# Patient Record
Sex: Male | Born: 1945 | Race: White | Hispanic: No | Marital: Married | Smoking: Never smoker
Health system: Southern US, Community
[De-identification: ages and names within clinical notes are randomized; demographics above are authoritative.]

## PROBLEM LIST (undated history)

## (undated) DIAGNOSIS — I10 Essential (primary) hypertension: Secondary | ICD-10-CM

## (undated) DIAGNOSIS — Z85528 Personal history of other malignant neoplasm of kidney: Secondary | ICD-10-CM

## (undated) DIAGNOSIS — E7800 Pure hypercholesterolemia, unspecified: Secondary | ICD-10-CM

## (undated) DIAGNOSIS — M1990 Unspecified osteoarthritis, unspecified site: Secondary | ICD-10-CM

## (undated) DIAGNOSIS — G4733 Obstructive sleep apnea (adult) (pediatric): Secondary | ICD-10-CM

## (undated) DIAGNOSIS — E119 Type 2 diabetes mellitus without complications: Secondary | ICD-10-CM

## (undated) DIAGNOSIS — K219 Gastro-esophageal reflux disease without esophagitis: Secondary | ICD-10-CM

## (undated) HISTORY — DX: Personal history of other malignant neoplasm of kidney: Z85.528

## (undated) HISTORY — DX: Gastro-esophageal reflux disease without esophagitis: K21.9

## (undated) HISTORY — PX: NEPHRECTOMY: SHX65

## (undated) HISTORY — DX: Pure hypercholesterolemia, unspecified: E78.00

## (undated) HISTORY — PX: TRANSURETHRAL RESECTION OF PROSTATE: SHX73

## (undated) HISTORY — PX: LUMBAR DISC SURGERY: SHX700

## (undated) HISTORY — DX: Obstructive sleep apnea (adult) (pediatric): G47.33

## (undated) HISTORY — DX: Essential (primary) hypertension: I10

## (undated) HISTORY — DX: Unspecified osteoarthritis, unspecified site: M19.90

## (undated) HISTORY — PX: CHOLECYSTECTOMY: SHX55

## (undated) HISTORY — DX: Type 2 diabetes mellitus without complications: E11.9

---

## 2013-11-06 ENCOUNTER — Institutional Professional Consult (permissible substitution): Payer: Self-pay | Admitting: Critical Care Medicine

## 2015-02-20 ENCOUNTER — Other Ambulatory Visit (HOSPITAL_COMMUNITY): Payer: Self-pay | Admitting: Ophthalmology

## 2015-02-20 DIAGNOSIS — H532 Diplopia: Secondary | ICD-10-CM

## 2015-03-06 ENCOUNTER — Ambulatory Visit (HOSPITAL_COMMUNITY): Admission: RE | Admit: 2015-03-06 | Payer: Medicare Other | Source: Ambulatory Visit

## 2015-03-06 ENCOUNTER — Ambulatory Visit (HOSPITAL_COMMUNITY): Payer: Medicare Other

## 2015-06-08 DIAGNOSIS — M5136 Other intervertebral disc degeneration, lumbar region: Secondary | ICD-10-CM | POA: Insufficient documentation

## 2015-06-08 DIAGNOSIS — M4726 Other spondylosis with radiculopathy, lumbar region: Secondary | ICD-10-CM | POA: Insufficient documentation

## 2015-06-08 DIAGNOSIS — M4316 Spondylolisthesis, lumbar region: Secondary | ICD-10-CM | POA: Insufficient documentation

## 2015-06-08 DIAGNOSIS — M51369 Other intervertebral disc degeneration, lumbar region without mention of lumbar back pain or lower extremity pain: Secondary | ICD-10-CM | POA: Insufficient documentation

## 2015-07-14 DIAGNOSIS — M25562 Pain in left knee: Secondary | ICD-10-CM | POA: Insufficient documentation

## 2016-04-05 DIAGNOSIS — M5416 Radiculopathy, lumbar region: Secondary | ICD-10-CM | POA: Insufficient documentation

## 2016-04-05 DIAGNOSIS — N3945 Continuous leakage: Secondary | ICD-10-CM | POA: Insufficient documentation

## 2016-06-08 DIAGNOSIS — N319 Neuromuscular dysfunction of bladder, unspecified: Secondary | ICD-10-CM | POA: Insufficient documentation

## 2016-06-08 DIAGNOSIS — N509 Disorder of male genital organs, unspecified: Secondary | ICD-10-CM | POA: Insufficient documentation

## 2016-06-08 DIAGNOSIS — N3941 Urge incontinence: Secondary | ICD-10-CM | POA: Insufficient documentation

## 2016-06-09 DIAGNOSIS — N179 Acute kidney failure, unspecified: Secondary | ICD-10-CM | POA: Insufficient documentation

## 2016-06-09 DIAGNOSIS — R310 Gross hematuria: Secondary | ICD-10-CM | POA: Insufficient documentation

## 2016-06-09 DIAGNOSIS — R319 Hematuria, unspecified: Secondary | ICD-10-CM | POA: Insufficient documentation

## 2016-06-12 DIAGNOSIS — R000 Tachycardia, unspecified: Secondary | ICD-10-CM | POA: Insufficient documentation

## 2017-05-02 DIAGNOSIS — C61 Malignant neoplasm of prostate: Secondary | ICD-10-CM | POA: Insufficient documentation

## 2017-06-26 DIAGNOSIS — I10 Essential (primary) hypertension: Secondary | ICD-10-CM | POA: Insufficient documentation

## 2017-06-26 DIAGNOSIS — G4730 Sleep apnea, unspecified: Secondary | ICD-10-CM | POA: Insufficient documentation

## 2017-06-26 DIAGNOSIS — E785 Hyperlipidemia, unspecified: Secondary | ICD-10-CM | POA: Insufficient documentation

## 2018-06-28 ENCOUNTER — Other Ambulatory Visit: Payer: Self-pay | Admitting: Urology

## 2018-06-28 DIAGNOSIS — C61 Malignant neoplasm of prostate: Secondary | ICD-10-CM

## 2018-07-07 ENCOUNTER — Other Ambulatory Visit: Payer: Medicare Other

## 2018-07-12 ENCOUNTER — Ambulatory Visit
Admission: RE | Admit: 2018-07-12 | Discharge: 2018-07-12 | Disposition: A | Discharge status: Home / Self Care | Payer: Medicare Other | Source: Ambulatory Visit | Attending: Urology | Admitting: Urology

## 2018-07-12 DIAGNOSIS — C61 Malignant neoplasm of prostate: Secondary | ICD-10-CM

## 2018-07-12 MED ORDER — GADOBENATE DIMEGLUMINE 529 MG/ML IV SOLN
10.0000 mL | Freq: Once | INTRAVENOUS | Status: AC | PRN
  Administered 2018-07-12: 10 mL via INTRAVENOUS

## 2018-07-27 DIAGNOSIS — E119 Type 2 diabetes mellitus without complications: Secondary | ICD-10-CM

## 2018-07-27 DIAGNOSIS — G4733 Obstructive sleep apnea (adult) (pediatric): Secondary | ICD-10-CM

## 2018-07-27 DIAGNOSIS — K219 Gastro-esophageal reflux disease without esophagitis: Secondary | ICD-10-CM

## 2018-07-27 DIAGNOSIS — I10 Essential (primary) hypertension: Secondary | ICD-10-CM

## 2018-07-27 DIAGNOSIS — N400 Enlarged prostate without lower urinary tract symptoms: Secondary | ICD-10-CM

## 2018-07-27 DIAGNOSIS — C61 Malignant neoplasm of prostate: Secondary | ICD-10-CM | POA: Diagnosis not present

## 2018-07-27 DIAGNOSIS — N183 Chronic kidney disease, stage 3 (moderate): Secondary | ICD-10-CM

## 2018-07-27 DIAGNOSIS — R079 Chest pain, unspecified: Secondary | ICD-10-CM

## 2018-07-27 DIAGNOSIS — E785 Hyperlipidemia, unspecified: Secondary | ICD-10-CM

## 2018-07-28 DIAGNOSIS — C61 Malignant neoplasm of prostate: Secondary | ICD-10-CM | POA: Diagnosis not present

## 2018-07-28 DIAGNOSIS — E119 Type 2 diabetes mellitus without complications: Secondary | ICD-10-CM | POA: Diagnosis not present

## 2018-07-28 DIAGNOSIS — N183 Chronic kidney disease, stage 3 (moderate): Secondary | ICD-10-CM | POA: Diagnosis not present

## 2018-07-28 DIAGNOSIS — R079 Chest pain, unspecified: Secondary | ICD-10-CM | POA: Diagnosis not present

## 2018-07-29 DIAGNOSIS — R079 Chest pain, unspecified: Secondary | ICD-10-CM | POA: Diagnosis not present

## 2019-03-01 ENCOUNTER — Ambulatory Visit: Payer: Medicare Other | Admitting: Sports Medicine

## 2019-03-01 ENCOUNTER — Encounter: Payer: Self-pay | Admitting: Sports Medicine

## 2019-03-01 ENCOUNTER — Other Ambulatory Visit: Payer: Self-pay

## 2019-03-01 VITALS — BP 123/75 | HR 81 | Temp 96.5°F

## 2019-03-01 DIAGNOSIS — M79675 Pain in left toe(s): Secondary | ICD-10-CM

## 2019-03-01 DIAGNOSIS — E1140 Type 2 diabetes mellitus with diabetic neuropathy, unspecified: Secondary | ICD-10-CM | POA: Diagnosis not present

## 2019-03-01 DIAGNOSIS — B351 Tinea unguium: Secondary | ICD-10-CM

## 2019-03-01 DIAGNOSIS — M79674 Pain in right toe(s): Secondary | ICD-10-CM

## 2019-03-01 NOTE — Patient Instructions (Signed)
Diabetes Mellitus and Foot Care  Foot care is an important part of your health, especially when you have diabetes. Diabetes may cause you to have problems because of poor blood flow (circulation) to your feet and legs, which can cause your skin to:   Become thinner and drier.   Break more easily.   Heal more slowly.   Peel and crack.  You may also have nerve damage (neuropathy) in your legs and feet, causing decreased feeling in them. This means that you may not notice minor injuries to your feet that could lead to more serious problems. Noticing and addressing any potential problems early is the best way to prevent future foot problems.  How to care for your feet  Foot hygiene   Wash your feet daily with warm water and mild soap. Do not use hot water. Then, pat your feet and the areas between your toes until they are completely dry. Do not soak your feet as this can dry your skin.   Trim your toenails straight across. Do not dig under them or around the cuticle. File the edges of your nails with an emery board or nail file.   Apply a moisturizing lotion or petroleum jelly to the skin on your feet and to dry, brittle toenails. Use lotion that does not contain alcohol and is unscented. Do not apply lotion between your toes.  Shoes and socks   Wear clean socks or stockings every day. Make sure they are not too tight. Do not wear knee-high stockings since they may decrease blood flow to your legs.   Wear shoes that fit properly and have enough cushioning. Always look in your shoes before you put them on to be sure there are no objects inside.   To break in new shoes, wear them for just a few hours a day. This prevents injuries on your feet.  Wounds, scrapes, corns, and calluses   Check your feet daily for blisters, cuts, bruises, sores, and redness. If you cannot see the bottom of your feet, use a mirror or ask someone for help.   Do not cut corns or calluses or try to remove them with medicine.   If you  find a minor scrape, cut, or break in the skin on your feet, keep it and the skin around it clean and dry. You may clean these areas with mild soap and water. Do not clean the area with peroxide, alcohol, or iodine.   If you have a wound, scrape, corn, or callus on your foot, look at it several times a day to make sure it is healing and not infected. Check for:  ? Redness, swelling, or pain.  ? Fluid or blood.  ? Warmth.  ? Pus or a bad smell.  General instructions   Do not cross your legs. This may decrease blood flow to your feet.   Do not use heating pads or hot water bottles on your feet. They may burn your skin. If you have lost feeling in your feet or legs, you may not know this is happening until it is too late.   Protect your feet from hot and cold by wearing shoes, such as at the beach or on hot pavement.   Schedule a complete foot exam at least once a year (annually) or more often if you have foot problems. If you have foot problems, report any cuts, sores, or bruises to your health care provider immediately.  Contact a health care provider if:     You have a medical condition that increases your risk of infection and you have any cuts, sores, or bruises on your feet.   You have an injury that is not healing.   You have redness on your legs or feet.   You feel burning or tingling in your legs or feet.   You have pain or cramps in your legs and feet.   Your legs or feet are numb.   Your feet always feel cold.   You have pain around a toenail.  Get help right away if:   You have a wound, scrape, corn, or callus on your foot and:  ? You have pain, swelling, or redness that gets worse.  ? You have fluid or blood coming from the wound, scrape, corn, or callus.  ? Your wound, scrape, corn, or callus feels warm to the touch.  ? You have pus or a bad smell coming from the wound, scrape, corn, or callus.  ? You have a fever.  ? You have a red line going up your leg.  Summary   Check your feet every day  for cuts, sores, red spots, swelling, and blisters.   Moisturize feet and legs daily.   Wear shoes that fit properly and have enough cushioning.   If you have foot problems, report any cuts, sores, or bruises to your health care provider immediately.   Schedule a complete foot exam at least once a year (annually) or more often if you have foot problems.  This information is not intended to replace advice given to you by your health care provider. Make sure you discuss any questions you have with your health care provider.  Document Released: 09/09/2000 Document Revised: 10/25/2017 Document Reviewed: 10/14/2016  Elsevier Interactive Patient Education  2019 [employer]

## 2019-03-01 NOTE — Progress Notes (Signed)
Subjective: Alexander King is a 73 y.o. male patient with history of diabetes who presents to office today complaining of long,mildly painful nails while ambulating in shoes; unable to trim. Patient states that the glucose reading this morning was 146 mg/dl. Patient denies any new changes in medication or new problems.Admits to tingling bilateral on Gabapentin.   Review of Systems  All other systems reviewed and are negative.    There are no active problems to display for this patient.  No current outpatient medications on file prior to visit.   No current facility-administered medications on file prior to visit.    Allergies  Allergen Reactions  . Sulfamethoxazole-Trimethoprim Hives, Itching and Rash    HIVES AND ITCHING HIVES AND ITCHING     No results found for this or any previous visit (from the past 2160 hour(s)).  Objective: General: Patient is awake, alert, and oriented x 3 and in no acute distress.  Integument: Skin is warm, dry and supple bilateral. Nails are tender, long, thickened and  dystrophic with subungual debris, consistent with onychomycosis, 1-5 bilateral. No signs of infection. No open lesions or preulcerative lesions present bilateral. Remaining integument unremarkable.  Vasculature:  Dorsalis Pedis pulse 1/4 bilateral. Posterior Tibial pulse  1/4 bilateral.  Capillary fill time <3 sec 1-5 bilateral. Positive hair growth to the level of the digits. Temperature gradient within normal limits. No varicosities present bilateral. No edema present bilateral.   Neurology: The patient has intact sensation measured with a 5.07/10g Semmes Weinstein Monofilament at all pedal sites bilateral . Vibratory sensation diminished bilateral with tuning fork. No Babinski sign present bilateral.   Musculoskeletal: No symptomatic pedal deformities noted bilateral. Muscular strength 5/5 in all lower extremity muscular groups bilateral without pain on range of motion . No tenderness  with calf compression bilateral.  Assessment and Plan: Problem List Items Addressed This Visit    None    Visit Diagnoses    Pain due to onychomycosis of toenails of both feet    -  Primary   Type 2 diabetes mellitus with diabetic neuropathy, without long-term current use of insulin ([place])         -Examined patient. -Discussed and educated patient on diabetic foot care, especially with  regards to the vascular, neurological and musculoskeletal systems.  -Stressed the importance of good glycemic control and the detriment of not  controlling glucose levels in relation to the foot. -Mechanically debrided all nails 1-5 bilateral using sterile nail nipper and filed with dremel without incident  -Answered all patient questions -Patient to return  in 3 months for at risk foot care -Patient advised to call the office if any problems or questions arise in the meantime.  Landis Martins, DPM

## 2019-03-22 ENCOUNTER — Ambulatory Visit (INDEPENDENT_AMBULATORY_CARE_PROVIDER_SITE_OTHER): Payer: Medicare Other | Admitting: Sports Medicine

## 2019-03-22 ENCOUNTER — Encounter: Payer: Self-pay | Admitting: Sports Medicine

## 2019-03-22 ENCOUNTER — Other Ambulatory Visit: Payer: Self-pay

## 2019-03-22 DIAGNOSIS — B351 Tinea unguium: Secondary | ICD-10-CM

## 2019-03-22 DIAGNOSIS — E1140 Type 2 diabetes mellitus with diabetic neuropathy, unspecified: Secondary | ICD-10-CM

## 2019-03-22 DIAGNOSIS — M79674 Pain in right toe(s): Secondary | ICD-10-CM

## 2019-03-22 DIAGNOSIS — M79675 Pain in left toe(s): Secondary | ICD-10-CM

## 2019-03-22 NOTE — Progress Notes (Signed)
Subjective: Alexander King is a 73 y.o. male patient with history of diabetes who presents to office today complaining of nails that seem to be growing out irregular reports that he is noticing that he has some sharp corners on both big toes that are decreased in as well as on the right great toe in the middle there is a roughness that he keeps getting caught on his sock.  Patient reports that he started to reach down and found to trim himself however because he is diabetic return to office. Patient states that the glucose reading this morning was 150 this morning.  There are no active problems to display for this patient.  Current Outpatient Medications on File Prior to Visit  Medication Sig Dispense Refill  . amLODipine (NORVASC) 5 MG tablet     . betaxolol (KERLONE) 10 MG tablet     . Cholecalciferol (VITAMIN D-1000 MAX ST) 25 MCG (1000 UT) tablet Take by mouth.    . clobetasol cream (TEMOVATE) 0.05 % Apply topically.    . diclofenac sodium (VOLTAREN) 1 % GEL     . famotidine (PEPCID) 20 MG tablet     . gabapentin (NEURONTIN) 600 MG tablet     . HUMALOG KWIKPEN 100 UNIT/ML KwikPen     . lovastatin (MEVACOR) 40 MG tablet     . mupirocin ointment (BACTROBAN) 2 %     . nystatin cream (MYCOSTATIN)     . NYSTATIN powder     . omeprazole (PRILOSEC) 40 MG capsule     . TOUJEO SOLOSTAR 300 UNIT/ML SOPN     . VICTOZA 18 MG/3ML SOPN      No current facility-administered medications on file prior to visit.    Allergies  Allergen Reactions  . Sulfamethoxazole-Trimethoprim Hives, Itching and Rash    HIVES AND ITCHING HIVES AND ITCHING     No results found for this or any previous visit (from the past 2160 hour(s)).  Objective: General: Patient is awake, alert, and oriented x 3 and in no acute distress.  Integument: Skin is warm, dry and supple bilateral. Nails are thickened and  dystrophic with subungual debris, consistent with onychomycosis, 1-5 bilateral with lots of dry skin and nail  and bilateral hallux borders with no signs of infection. No open lesions or preulcerative lesions present bilateral. Remaining integument unremarkable.  Vasculature:  Dorsalis Pedis pulse 1/4 bilateral. Posterior Tibial pulse  1/4 bilateral.  Capillary fill time <3 sec 1-5 bilateral. Positive hair growth to the level of the digits. Temperature gradient within normal limits. No varicosities present bilateral. No edema present bilateral.   Neurology: The patient has intact sensation measured with a 5.07/10g Semmes Weinstein Monofilament at all pedal sites bilateral. Vibratory sensation diminished bilateral with tuning fork. No Babinski sign present bilateral.   Musculoskeletal: No symptomatic pedal deformities noted bilateral. Muscular strength 5/5 in all lower extremity muscular groups bilateral without pain on range of motion . No tenderness with calf compression bilateral.  Assessment and Plan: Problem List Items Addressed This Visit    None    Visit Diagnoses    Pain due to onychomycosis of toenails of both feet    -  Primary   Type 2 diabetes mellitus with diabetic neuropathy, without long-term current use of insulin ([place])         -Examined patient. -Mechanically debrided all nails 1-5 bilateral using sterile nail nipper and filed with dremel without incident and no charge since this was done just a few weeks ago -  Advised patient if his big toenails tend to keep growing out into the skin and his nail trims do not last may benefit in the future from a nail avulsion procedure -Answered all patient questions -Patient to return  in 3 months for at risk foot care -Patient advised to call the office if any problems or questions arise in the meantime.  Landis Martins, DPM

## 2019-04-25 ENCOUNTER — Ambulatory Visit: Payer: Medicare Other | Admitting: Physician Assistant

## 2019-05-06 ENCOUNTER — Ambulatory Visit (INDEPENDENT_AMBULATORY_CARE_PROVIDER_SITE_OTHER): Payer: Medicare Other | Admitting: Orthopaedic Surgery

## 2019-05-06 ENCOUNTER — Ambulatory Visit: Payer: Self-pay

## 2019-05-06 ENCOUNTER — Other Ambulatory Visit: Payer: Self-pay

## 2019-05-06 VITALS — Ht 67.0 in | Wt 266.0 lb

## 2019-05-06 DIAGNOSIS — M1712 Unilateral primary osteoarthritis, left knee: Secondary | ICD-10-CM | POA: Diagnosis not present

## 2019-05-06 DIAGNOSIS — M5442 Lumbago with sciatica, left side: Secondary | ICD-10-CM | POA: Diagnosis not present

## 2019-05-06 DIAGNOSIS — M4807 Spinal stenosis, lumbosacral region: Secondary | ICD-10-CM

## 2019-05-06 DIAGNOSIS — G8929 Other chronic pain: Secondary | ICD-10-CM | POA: Diagnosis not present

## 2019-05-06 NOTE — Progress Notes (Signed)
Office Visit Note   Patient: Alexander King           Date of Birth: 1945/11/09           MRN: [id number] Visit Date: 05/06/2019              Requested by: [name], MD [place],  [place] [id number] PCP: [name], MD   Assessment & Plan: Visit Diagnoses:  1. Chronic left-sided low back pain with left-sided sciatica   2. Unilateral primary osteoarthritis, left knee     Plan: The patient understands that I am not a spine specialist.  I will send him for an MRI with contrast of the lumbar spine and then refer him to Dr. Louanne Skye as it relates to his lumbar spine.  He may end up just needing some type of injection to help.  Certainly a knee replacement would be worthwhile at some point and he does understand that our hospitals regulations for knee replacement is for a BMI of 39.4 or below.  He needs to still work on weight loss. The patient meets the AMA guidelines for Morbid (severe) obesity with a BMI > 40.0 and I have recommended weight loss.  Follow-Up Instructions: follow-up after his MRI  Orders:  Orders Placed This Encounter  Procedures   XR Lumbar Spine 2-3 Views   No orders of the defined types were placed in this encounter.     Procedures: No procedures performed   Clinical Data: No additional findings.   Subjective: Chief Complaint  Patient presents with   Lower Back - Pain   Left Leg - Pain  The patient is someone I am seeing for the first time.  He comes for second opinion as it relates mainly to his lumbar spine but also his left knee.  He was being seen mainly for his spine.  He has a history of a L4-L5 fusion done in [place] of the lumbar spine in 2017.  He has known "bone-on-bone" wear of his left knee and has had a knee arthroscopy in the past as well as hyaluronic acid for his left knee.  He does use topical anti-inflammatories in his left knee.  He is someone who does have a history of diabetes and reports a hemoglobin A1c of below 7.   He reports left-sided low back pain and points to the lower aspect of his lumbar spine to the left side is source of his pain.  Denies any groin pain.  He does report locking catching and pain in his left knee.  His BMI is 41.66.  HPI  Review of Systems He currently denies any headache, chest pain, shortness of breath, fever, chills, nausea, vomiting  Objective: Vital Signs: Ht 5\' 7"  (1.702 m)    Wt 266 lb (120.7 kg)    BMI 41.66 kg/m   Physical Exam He is alert and orient x3 and in no acute distress Ortho Exam Examination of his lumbar spine shows limited flexion and extension with a stiff lumbar spine.  Both hips have fluid and full range of motion actively and passively with no pain in the groin.  His left knee has significant patellofemoral crepitation.  There is varus malalignment.  There is medial joint line tenderness as well. Specialty Comments:  No specialty comments available.  Imaging: Xr Lumbar Spine 2-3 Views  Result Date: 05/06/2019 2 views of the lumbar spine show previous L4-L5 instrumented fusion.  There is severe degenerative changes above and  below the fusion.    PMFS History: Patient Active Problem List   Diagnosis Date Noted   Unilateral primary osteoarthritis, left knee 05/06/2019   No past medical history on file.  No family history on file.  No past surgical history on file. Social History   Occupational History   Not on file  Tobacco Use   Smoking status: Not on file  Substance and Sexual Activity   Alcohol use: Not on file   Drug use: Not on file   Sexual activity: Not on file

## 2019-05-13 ENCOUNTER — Telehealth: Payer: Self-pay | Admitting: *Deleted

## 2019-05-13 NOTE — Telephone Encounter (Signed)
Pt called and left vm to me stating he would like to have his MRI done in [place] at the MRI center there, I sent fax to [facility] MRI center and contacted pt to advise him I have done this and he could call the imaging center or wait until they call. Pt states he will call.

## 2019-05-14 ENCOUNTER — Other Ambulatory Visit: Payer: Self-pay | Admitting: *Deleted

## 2019-05-14 ENCOUNTER — Telehealth: Payer: Self-pay | Admitting: *Deleted

## 2019-05-14 DIAGNOSIS — G8929 Other chronic pain: Secondary | ICD-10-CM

## 2019-05-14 NOTE — Telephone Encounter (Signed)
Received call from [facility] stating pt has appt scheduled tomorrow 05/15/19 and needs lab orders placed and faxed to them for BUN and CREATINE. Order has been placed and faxed to [phone]

## 2019-05-20 ENCOUNTER — Ambulatory Visit: Payer: Medicare Other | Admitting: Orthopaedic Surgery

## 2019-05-23 ENCOUNTER — Ambulatory Visit: Payer: Medicare Other | Admitting: Orthopaedic Surgery

## 2019-05-31 ENCOUNTER — Ambulatory Visit (INDEPENDENT_AMBULATORY_CARE_PROVIDER_SITE_OTHER): Payer: Medicare Other | Admitting: Specialist

## 2019-05-31 ENCOUNTER — Ambulatory Visit: Payer: Self-pay

## 2019-05-31 ENCOUNTER — Ambulatory Visit: Payer: Medicare Other | Admitting: Sports Medicine

## 2019-05-31 ENCOUNTER — Other Ambulatory Visit: Payer: Self-pay

## 2019-05-31 ENCOUNTER — Encounter: Payer: Self-pay | Admitting: Specialist

## 2019-05-31 VITALS — BP 141/74 | HR 83 | Ht 67.0 in | Wt 266.0 lb

## 2019-05-31 DIAGNOSIS — M5116 Intervertebral disc disorders with radiculopathy, lumbar region: Secondary | ICD-10-CM | POA: Diagnosis not present

## 2019-05-31 DIAGNOSIS — G8929 Other chronic pain: Secondary | ICD-10-CM

## 2019-05-31 DIAGNOSIS — M545 Low back pain: Secondary | ICD-10-CM | POA: Diagnosis not present

## 2019-05-31 DIAGNOSIS — Z981 Arthrodesis status: Secondary | ICD-10-CM

## 2019-05-31 DIAGNOSIS — M48062 Spinal stenosis, lumbar region with neurogenic claudication: Secondary | ICD-10-CM | POA: Diagnosis not present

## 2019-05-31 DIAGNOSIS — M419 Scoliosis, unspecified: Secondary | ICD-10-CM

## 2019-05-31 NOTE — Progress Notes (Signed)
Office Visit Note   Patient: Alexander King           Date of Birth: [birth year]           MRN: [id number] Visit Date: 05/31/2019              Requested by: [name], MD [place],  [place] [id number] PCP: [name], MD   Assessment & Plan: Visit Diagnoses:  1. Chronic right-sided low back pain without sciatica   2. Spinal stenosis of lumbar region with neurogenic claudication   3. Intervertebral disc disorders with radiculopathy, lumbar region   4. History of lumbar fusion   5. Scoliosis of thoracolumbar spine, unspecified scoliosis type     Plan: Avoid bending, stooping and avoid lifting weights greater than 10 lbs. Avoid prolong standing and walking. Avoid frequent bending and stooping  No lifting greater than 10 lbs. May use ice or moist heat for pain. Weight loss is of benefit. Handicap license is approved. Recommed that we arrange for left L5 nerve block, please call and talk with your family and decide with you wish for [place] to arrange for Dr. Romona Curls secretary/Assistant will call to arrange for epidural steroid injection   Follow-Up Instructions: Return in about 4 weeks (around 06/28/2019).   Orders:  Orders Placed This Encounter  Procedures   XR Lumb Spine Flex&Ext Only   No orders of the defined types were placed in this encounter.     Procedures: No procedures performed   Clinical Data: No additional findings.   Subjective: Chief Complaint  Patient presents with   Lower Back - Pain    73 year old male with history of lumbar fusion L4-5 by Dr. Ian Malkin of [facility] 12/2015. The pain prior to surgery was mainly back pain with radiation into the left knee. Post fusion still with pain in the left knee. Injection of the knee was Helpful for about a month. Only allowed to have an injection every 3 months. He has been told he is high risk for surgery of the knee. He is now 3 1/2 years post op and he has pain with yard work and notices pain in  the right knee the longer he is up and trying to do work. He uses a lawn chair and stops and sits for a while and then he can get going again. At night he will have left buttock and left hip pain that will cause him to get up and go sit down he can return to moving and usually does not return to sleep. Pain improves with getting into a recliner. No numbness or tingling. That spot in the left PSIS is a little bit swollen. The left leg does not feel week. He goes up stairs leading with the right leg. He is able to reach his shoes and socks with out difficulty. He takes gabapentin for tingling in the bottom of his feet that is usually constant without the gabapentin. He is not able to walk a mile, but can walk further than he could before the surgery, there was improvement in standing and walking tolerance. Wish he had done knee surgery before back surgery. No bowel or bladder changes. Did have a change in urine after the surgery and he was only able to pass urine in small amounts, he started intermittant catheterization by Dr. Felipa Eth a urologist in [place]. He has been diagnosed with prostate Ca,  The  PSA in the safe range and the PSA  is being monitored and he has had a biopsy and had the prostate cystoscopy and he Had biopsy with one of several biopsies positive. The the left knee is stiff all the time with first standing and walking. The arthritis pain in the left knee improved for one month and he was sleeping better and standing and walking better. His BMI is increased and he is considering a bariatric procedure.    Review of Systems  Constitutional: Negative for activity change, appetite change, chills, diaphoresis, fatigue, fever and unexpected weight change.  HENT: Positive for rhinorrhea. Negative for congestion, dental problem, drooling, ear discharge, ear pain, facial swelling, hearing loss, mouth sores, nosebleeds, postnasal drip, sinus pressure, sinus pain, sneezing, sore throat, tinnitus, trouble  swallowing and voice change.   Eyes: Positive for visual disturbance. Negative for photophobia, pain, discharge, redness and itching.  Respiratory: Negative.  Negative for apnea, cough, choking, chest tightness, shortness of breath, wheezing and stridor.   Cardiovascular: Negative.  Negative for chest pain, palpitations and leg swelling.  Gastrointestinal: Negative for abdominal distention, abdominal pain, anal bleeding, blood in stool, constipation, diarrhea, nausea, rectal pain and vomiting.  Endocrine: Negative for cold intolerance, heat intolerance, polydipsia, polyphagia and polyuria.  Genitourinary: Negative for difficulty urinating, dysuria, enuresis, flank pain, frequency and urgency.  Musculoskeletal: Positive for back pain and gait problem. Negative for arthralgias, joint swelling, myalgias, neck pain and neck stiffness.  Skin: Negative.  Negative for color change, pallor, rash and wound.  Allergic/Immunologic: Negative for environmental allergies, food allergies and immunocompromised state.  Neurological: Positive for weakness and numbness. Negative for dizziness, tremors, seizures, syncope, facial asymmetry, speech difficulty, light-headedness and headaches.  Hematological: Negative for adenopathy. Does not bruise/bleed easily.  Psychiatric/Behavioral: Negative for agitation, behavioral problems, confusion, decreased concentration, dysphoric mood, hallucinations, self-injury, sleep disturbance and suicidal ideas. The patient is not nervous/anxious and is not hyperactive.      Objective: Vital Signs: BP (!) 141/74 (BP Location: Left Arm, Patient Position: Sitting)    Pulse 83    Ht 5\' 7"  (1.702 m)    Wt 266 lb (120.7 kg)    BMI 41.66 kg/m   Physical Exam Constitutional:      Appearance: He is well-developed.  HENT:     Head: Normocephalic and atraumatic.  Eyes:     Pupils: Pupils are equal, round, and reactive to light.  Neck:     Musculoskeletal: Normal range of motion and  neck supple.  Pulmonary:     Effort: Pulmonary effort is normal.     Breath sounds: Normal breath sounds.  Abdominal:     General: Bowel sounds are normal.     Palpations: Abdomen is soft.  Skin:    General: Skin is warm and dry.  Neurological:     Mental Status: He is alert and oriented to person, place, and time.  Psychiatric:        Behavior: Behavior normal.        Thought Content: Thought content normal.        Judgment: Judgment normal.     Back Exam   Tenderness  The patient is experiencing tenderness in the lumbar.  Range of Motion  Extension: normal  Flexion:  80 abnormal  Lateral bend right: normal  Lateral bend left: normal  Rotation right: normal  Rotation left: normal   Muscle Strength  Right Quadriceps:  5/5  Left Quadriceps:  5/5  Right Hamstrings:  5/5  Left Hamstrings:  5/5   Reflexes  Patellar: 0/4 Achilles:  0/4 Babinski's sign: normal   Other  Toe walk: normal Heel walk: normal Sensation: normal Gait: antalgic  Erythema: no back redness Scars: present  Comments:  SLR is negative,       Specialty Comments:  No specialty comments available.  Imaging: No results found.   PMFS History: Patient Active Problem List   Diagnosis Date Noted   Unilateral primary osteoarthritis, left knee 05/06/2019   History reviewed. No pertinent past medical history.  History reviewed. No pertinent family history.  History reviewed. No pertinent surgical history. Social History   Occupational History   Not on file  Tobacco Use   Smoking status: Never Smoker   Smokeless tobacco: Current User    Types: Chew  Substance and Sexual Activity   Alcohol use: Not Currently   Drug use: Never   Sexual activity: Not Currently

## 2019-05-31 NOTE — Patient Instructions (Signed)
Avoid bending, stooping and avoid lifting weights greater than 10 lbs. Avoid prolong standing and walking. Avoid frequent bending and stooping  No lifting greater than 10 lbs. May use ice or moist heat for pain. Weight loss is of benefit. Handicap license is approved. Recommed that we arrange for left L5 nerve block, please call and talk with your family and decide with you wish for [place] to arrange for Dr. Romona Curls secretary/Assistant will call to arrange for epidural steroid injection

## 2019-06-05 ENCOUNTER — Ambulatory Visit: Payer: Medicare Other | Admitting: Specialist

## 2019-06-06 ENCOUNTER — Ambulatory Visit: Payer: Medicare Other | Admitting: Specialist

## 2019-06-13 ENCOUNTER — Other Ambulatory Visit: Payer: Medicare Other

## 2019-06-19 ENCOUNTER — Ambulatory Visit (INDEPENDENT_AMBULATORY_CARE_PROVIDER_SITE_OTHER): Payer: Medicare Other | Admitting: Sports Medicine

## 2019-06-19 ENCOUNTER — Encounter: Payer: Self-pay | Admitting: Sports Medicine

## 2019-06-19 ENCOUNTER — Other Ambulatory Visit: Payer: Self-pay

## 2019-06-19 DIAGNOSIS — M79675 Pain in left toe(s): Secondary | ICD-10-CM

## 2019-06-19 DIAGNOSIS — B351 Tinea unguium: Secondary | ICD-10-CM

## 2019-06-19 DIAGNOSIS — E1140 Type 2 diabetes mellitus with diabetic neuropathy, unspecified: Secondary | ICD-10-CM | POA: Diagnosis not present

## 2019-06-19 DIAGNOSIS — M79674 Pain in right toe(s): Secondary | ICD-10-CM

## 2019-06-19 NOTE — Progress Notes (Signed)
Subjective: Alexander King is a 73 y.o. male patient with history of diabetes who presents to office today complaining of long,mildly painful nails while ambulating in shoes; unable to trim. Patient states that the glucose reading this morning was 98mg /dl, last A1c was 7.9 and last visit to PCP Dr. Humphrey Rolls was last week. Patient denies any new changes in medication or new problems.   Patient Active Problem List   Diagnosis Date Noted  . Unilateral primary osteoarthritis, left knee 05/06/2019   Current Outpatient Medications on File Prior to Visit  Medication Sig Dispense Refill  . amLODipine (NORVASC) 5 MG tablet     . azelastine (OPTIVAR) 0.05 % ophthalmic solution     . betaxolol (KERLONE) 10 MG tablet     . Cholecalciferol (VITAMIN D-1000 MAX ST) 25 MCG (1000 UT) tablet Take by mouth.    . clobetasol cream (TEMOVATE) 0.05 % Apply topically.    . diclofenac sodium (VOLTAREN) 1 % GEL     . famotidine (PEPCID) 20 MG tablet     . fluticasone (FLONASE) 50 MCG/ACT nasal spray INSTILL 2 SPRAYS IN EACH NOSTRIL ONCE D    . gabapentin (NEURONTIN) 600 MG tablet     . HUMALOG KWIKPEN 100 UNIT/ML KwikPen     . lovastatin (MEVACOR) 40 MG tablet     . mupirocin ointment (BACTROBAN) 2 %     . NOVOFINE PLUS 32G X 4 MM MISC USE 6 TIMES DAILY AS DIRECTED    . nystatin cream (MYCOSTATIN)     . NYSTATIN powder     . omeprazole (PRILOSEC) 40 MG capsule     . ONETOUCH ULTRA test strip 2 (two) times daily. use for testing    . TOUJEO SOLOSTAR 300 UNIT/ML SOPN     . VICTOZA 18 MG/3ML SOPN      No current facility-administered medications on file prior to visit.    Allergies  Allergen Reactions  . Sulfamethoxazole-Trimethoprim Hives, Itching and Rash    HIVES AND ITCHING HIVES AND ITCHING     No results found for this or any previous visit (from the past 2160 hour(s)).  Objective: General: Patient is awake, alert, and oriented x 3 and in no acute distress.  Integument: Skin is warm, dry and supple  bilateral. Nails are tender, long, thickened and  dystrophic with subungual debris, consistent with onychomycosis, 1-5 bilateral. No signs of infection. No open lesions or preulcerative lesions present bilateral. Remaining integument unremarkable.  Vasculature:  Dorsalis Pedis pulse 1/4 bilateral. Posterior Tibial pulse  1/4 bilateral.  Capillary fill time <3 sec 1-5 bilateral. Positive hair growth to the level of the digits. Temperature gradient within normal limits. No varicosities present bilateral. No edema present bilateral.   Neurology: The patient has intact sensation measured with a 5.07/10g Semmes Weinstein Monofilament at all pedal sites bilateral . Vibratory sensation diminished bilateral with tuning fork. No Babinski sign present bilateral.   Musculoskeletal: No symptomatic pedal deformities noted bilateral. Muscular strength 5/5 in all lower extremity muscular groups bilateral without pain on range of motion . No tenderness with calf compression bilateral.  Assessment and Plan: Problem List Items Addressed This Visit    None    Visit Diagnoses    Pain due to onychomycosis of toenails of both feet    -  Primary   Type 2 diabetes mellitus with diabetic neuropathy, without long-term current use of insulin ([place])         -Examined patient. -Re-discussed and educated patient on diabetic foot  care, especially with  regards to the vascular, neurological and musculoskeletal systems.  -Stressed the importance of good glycemic control and the detriment of not  controlling glucose levels in relation to the foot. -Mechanically debrided all nails 1-5 bilateral using sterile nail nipper and filed with dremel without incident  -Answered all patient questions -Patient to return  in 3 months for at risk foot care -Patient advised to call the office if any problems or questions arise in the meantime.  Landis Martins, DPM

## 2019-06-28 ENCOUNTER — Ambulatory Visit (INDEPENDENT_AMBULATORY_CARE_PROVIDER_SITE_OTHER): Payer: Medicare Other | Admitting: Specialist

## 2019-06-28 ENCOUNTER — Encounter: Payer: Self-pay | Admitting: Specialist

## 2019-06-28 ENCOUNTER — Ambulatory Visit: Payer: Medicare Other | Admitting: Specialist

## 2019-06-28 VITALS — BP 130/75 | HR 86 | Ht 67.0 in | Wt 266.0 lb

## 2019-06-28 DIAGNOSIS — M1712 Unilateral primary osteoarthritis, left knee: Secondary | ICD-10-CM

## 2019-06-28 DIAGNOSIS — Z981 Arthrodesis status: Secondary | ICD-10-CM

## 2019-06-28 DIAGNOSIS — G8929 Other chronic pain: Secondary | ICD-10-CM

## 2019-06-28 DIAGNOSIS — M533 Sacrococcygeal disorders, not elsewhere classified: Secondary | ICD-10-CM

## 2019-06-28 NOTE — Progress Notes (Signed)
Office Visit Note   Patient: Alexander King           Date of Birth: [birth year]           MRN: [id number] Visit Date: 06/28/2019              Requested by: [name], MD [place],  [place] [id number] PCP: [name], MD   Assessment & Plan: Visit Diagnoses:  1. Chronic left SI joint pain   2. Arthritis of left knee   3. S/P lumbar fusion     Plan: Today patient localizes most of his pain to the left SI joint.  I recommend trying an ultrasound-guided diagnostic/therapeutic left SI joint Marcaine/Depo-Medrol injection with Dr. Junius Roads.  Patient will follow-up with me in 2 weeks for recheck to check his response.  I asked him to pay close attention to how he feels after the injection.  Patient does have a history of end-stage DJD left knee and I think that this is complicating his left sided symptoms.  I did asked him to speak to his primary care physician to see if he would be given a medical clearance if he were to undergo left total knee replacement.  He states that he has seen 2 orthopedic surgeons in the area that told him that he is not a good candidate for the procedure.  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     Avoid bending, stooping and avoid lifting weights greater than 10 lbs. Avoid prolong standing and walking. Avoid frequent bending and stooping  No lifting greater than 10 lbs. May use ice or moist heat for pain. Weight loss is of benefit. Handicap license is approved. Dr. Junius Roads secretary/Assistant will call to arrange for Sacroiliac injection under ultrasound guidance local and steroid injection   Follow-Up Instructions: Return in about 2 weeks (around 07/12/2019) for With Jeneen Rinks for recheck after left SI joint injection.   Orders:  No orders of the defined types were placed in this encounter.  No orders of the defined types were placed in this  encounter.     Procedures: No procedures performed   Clinical Data: No additional findings.   Subjective: Chief Complaint  Patient presents with   Lower Back - Follow-up    HPI 73 year old white male returns with complaints of left-sided low back pain.  He has not had a lumbar ESI but states that he was supposed to bring family member in today to discuss the injection.  Today he is localizing most of his pain to the left SI joint.  Not having any radicular pain in either leg.  Patient states that he continues to have pain in his left knee where he has known end-stage DJD.  States that he has seen to orthopedic surgeons in the area and they said that he is not a good candidate for knee replacement.  Patient does not have a history of cardiac or pulmonary issues.  Feels like the left knee pain is having a significant negative impact on his quality of life.  Knee pain also aggravating his low back issues.   Objective: Vital Signs: BP 130/75 (BP Location: Left Arm, Patient Position: Sitting)   Pulse 86   Ht 5\' 7"  (1.702 m)   Wt 266 lb (120.7 kg)   BMI 41.66 kg/m   Physical Exam HENT:  Head: Normocephalic and atraumatic.  Skin:    General: Skin is warm and dry.  Neurological:     General: No focal deficit present.     Mental Status: He is alert and oriented to person, place, and time.  Psychiatric:        Mood and Affect: Mood normal.     Ortho Exam  Specialty Comments:  No specialty comments available.  Imaging: No results found.   PMFS History: Patient Active Problem List   Diagnosis Date Noted   Unilateral primary osteoarthritis, left knee 05/06/2019   History reviewed. No pertinent past medical history.  History reviewed. No pertinent family history.  History reviewed. No pertinent surgical history. Social History   Occupational History   Not on file  Tobacco Use   Smoking status: Never Smoker   Smokeless tobacco: Current User    Types: Chew   Substance and Sexual Activity   Alcohol use: Not Currently   Drug use: Never   Sexual activity: Not Currently

## 2019-06-28 NOTE — Progress Notes (Signed)
Subjective: He is here for ultrasound-guided left sacroiliac injection.  Pain in that area status post lumbar fusion.  Objective: Point tender over the left SI joint.  Procedure: Ultrasound-guided left sacroiliac injection: After sterile prep with Betadine, injected 8 cc 1% lidocaine without epinephrine and 40 mg methylprednisolone into the SI joint.  He had immediate significant improvement in pain.  He will follow-up in 2 weeks with Benjiman Core as directed.

## 2019-07-10 ENCOUNTER — Other Ambulatory Visit: Payer: Self-pay

## 2019-07-10 ENCOUNTER — Encounter: Payer: Self-pay | Admitting: Surgery

## 2019-07-10 ENCOUNTER — Ambulatory Visit (INDEPENDENT_AMBULATORY_CARE_PROVIDER_SITE_OTHER): Payer: Medicare Other | Admitting: Surgery

## 2019-07-10 VITALS — Ht 67.0 in | Wt 266.0 lb

## 2019-07-10 DIAGNOSIS — M1712 Unilateral primary osteoarthritis, left knee: Secondary | ICD-10-CM

## 2019-07-10 DIAGNOSIS — M533 Sacrococcygeal disorders, not elsewhere classified: Secondary | ICD-10-CM

## 2019-07-10 DIAGNOSIS — G8929 Other chronic pain: Secondary | ICD-10-CM

## 2019-07-10 NOTE — Progress Notes (Signed)
   Office Visit Note   Patient: Alexander King           Date of Birth: 1946/09/14           MRN: [id number] Visit Date: 07/10/2019              Requested by: [name], MD [place],  [place] [address] PCP: [name], MD   Assessment & Plan: Visit Diagnoses:  1. Chronic left SI joint pain   2. Arthritis of left knee     Plan: Patient had good relief with diagnostic/therapeutic SI joint injection.  We discussed possible repeating that injection at some point in time if symptoms return versus sending him to Dr. Ernestina Patches to discuss possible radiofrequency ablation.  I will speak with Dr. Louanne Skye about the possibility of scheduling patient for left total knee replacement.  Follow-Up Instructions: Return in about 4 weeks (around 08/07/2019).   Orders:  No orders of the defined types were placed in this encounter.  No orders of the defined types were placed in this encounter.     Procedures: No procedures performed   Clinical Data: No additional findings.   Subjective: Chief Complaint  Patient presents with  . Left Knee - Follow-up    HPI Patient comes in for recheck of his left SI joint pain and end-stage DJD left knee.  Ultrasound-guided left SI joint injection performed by Dr. Junius Roads last week gave about 90% relief in this continues to be doing well.  He is very pleased with that.  Continues have ongoing pain in his left knee where he has known end-stage DJD.  He did obtain medical clearance as I had asked him to do last office visit with me.  He was hoping to be able to schedule left total knee replacement.  Advised patient I had not talked to Dr. Louanne Skye about this yet. Review of Systems No current cardiopulmonary GI GU issues  Objective: Vital Signs: Ht 5\' 7"  (1.702 m)   Wt 266 lb (120.7 kg)   BMI 41.66 kg/m   Physical Exam Gait is antalgic.  Negative logroll hips.  Left knee positive crepitus.  Joint line tender.  Does have some swelling with small  effusion. Ortho Exam  Specialty Comments:  No specialty comments available.  Imaging: No results found.   PMFS History: Patient Active Problem List   Diagnosis Date Noted  . Unilateral primary osteoarthritis, left knee 05/06/2019   History reviewed. No pertinent past medical history.  History reviewed. No pertinent family history.  History reviewed. No pertinent surgical history. Social History   Occupational History  . Not on file  Tobacco Use  . Smoking status: Never Smoker  . Smokeless tobacco: Current User    Types: Chew  Substance and Sexual Activity  . Alcohol use: Not Currently  . Drug use: Never  . Sexual activity: Not Currently

## 2019-07-11 ENCOUNTER — Telehealth: Payer: Self-pay | Admitting: Specialist

## 2019-07-11 NOTE — Telephone Encounter (Signed)
Patient called. Would like to know if you received notes from Dr. Chancy Milroy. His call back number is [phone]

## 2019-07-12 ENCOUNTER — Telehealth: Payer: Self-pay | Admitting: Specialist

## 2019-07-12 NOTE — Telephone Encounter (Signed)
Patient called, wants to proceed with scheduling total knee surgery. Callback [phone]

## 2019-07-12 NOTE — Telephone Encounter (Signed)
We haven't received yet. I spoke with patient this am. He mailed it 10/15. I told him when we received it, I would get it to you and Dr. Louanne Skye. I also sent msg to [facility] that he wished to proceed with scheduling TKR surgery.He voiced understanding.

## 2019-07-12 NOTE — Telephone Encounter (Signed)
Have you seen anything come in for this patient yet?

## 2019-07-16 NOTE — Telephone Encounter (Signed)
I called patient.  He has appointment with Dr. Louanne Skye tomorrow.  He states he has clearance from PCP.  I will talk to him while he is here tomorrow.

## 2019-07-17 ENCOUNTER — Encounter: Payer: Self-pay | Admitting: Specialist

## 2019-07-17 ENCOUNTER — Ambulatory Visit (INDEPENDENT_AMBULATORY_CARE_PROVIDER_SITE_OTHER): Payer: Medicare Other | Admitting: Specialist

## 2019-07-17 ENCOUNTER — Other Ambulatory Visit: Payer: Self-pay

## 2019-07-17 VITALS — BP 117/65 | HR 83 | Ht 67.0 in | Wt 266.0 lb

## 2019-07-17 DIAGNOSIS — Z981 Arthrodesis status: Secondary | ICD-10-CM

## 2019-07-17 DIAGNOSIS — M1712 Unilateral primary osteoarthritis, left knee: Secondary | ICD-10-CM

## 2019-07-17 NOTE — Progress Notes (Signed)
Office Visit Note   Patient: Alexander King           Date of Birth: 1946/04/03           MRN: [id number] Visit Date: 07/17/2019              Requested by: [name], MD [place],  [place] [address] PCP: [name], MD   Assessment & Plan: Visit Diagnoses:  1. Unilateral primary osteoarthritis, left knee   2. History of lumbar fusion     Plan: Knee is suffering from osteoarthritis, only real proven treatments are Weight loss, NSIADs like diclofenac and exercise. Well padded shoes help. Ice the knee 2Knee is suffering from osteoarthritis, only real proven treatments are Weight loss, do not take NSIADs like diclofenac, motrin or alleve and exercise. Well padded shoes help. Ice the knee 2-3 times a day 15-20 mins at a time.-3 times a day 15-20 mins at a time. Hot showers in the AM.  Injection with steroid may be of benefit. Hemp CBD capsules, amazon.com 5,000-7,000 mg per bottle, 60 capsules per bottle, take one capsule twice a day. Cane in the left hand to use with left leg weight bearing. Follow-Up Instructions: No follow-ups on file.   Orders:  No orders of the defined types were placed in this encounter.  No orders of the defined types were placed in this encounter.     Procedures: No procedures performed   Clinical Data: No additional findings.   Subjective: Chief Complaint  Patient presents with  . Left Knee - Pain    73 year old male with history of prostate Ca followed by Dr. Felipa Eth in [facility], he presently has a bladder infection and has infections of the bladder infrequently, last almost 9 months ago, July 27, Sept. 17th and this Monday diagnosis of UTI. He didn't start with problems with UTIs until after back surgery. Dr. Jacki Cones performed surgery With fusion L4-5 for 01/11/2016 with urinary incontinence since surgery and has been on a self catheterization  Program. 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 He has had a history of urosepsis in Sept 14th 2017 with hospitalization. Now with left knee pain with sitting and starting to stand and walk. No AM pain but pain as the day goes on. There is pain with weight bearing the longer he is up and walking. He has not taken any meds but has tried  CBD but finds it to be expensive. He has been losing some weight 15 lbs since last year. He has diabetes and uses 3 flex pens for this victosa,  Lantus, Teje[place], and regular Humalin insulin. Presently his Hgb A1c. He was seen by  Benjiman Core who recommended considering a TKR.    Review of Systems  Constitutional: Negative for activity change, appetite change, chills, diaphoresis, fatigue, fever and unexpected weight change.  HENT: Negative.  Negative for congestion, dental problem, drooling, ear discharge, ear pain, facial swelling, hearing loss, mouth sores, nosebleeds, postnasal drip and rhinorrhea.   Eyes: Positive for visual disturbance (has a tear in one of the eyes, small and old). Negative for photophobia, pain, discharge, redness and itching.  Respiratory: Positive for apnea, cough, shortness of breath and wheezing. Negative for choking, chest tightness and stridor.   Cardiovascular: Negative for chest pain, palpitations and leg swelling.  Gastrointestinal: Negative.  Negative for  abdominal distention, abdominal pain, anal bleeding, blood in stool, constipation, diarrhea, nausea, rectal pain and vomiting.  Endocrine: Negative for cold intolerance, heat intolerance, polydipsia, polyphagia and polyuria.  Genitourinary: Negative.  Negative for difficulty urinating, dysuria, enuresis, flank pain, frequency, hematuria and urgency.  Musculoskeletal: Positive for arthralgias. Negative for back pain ("4" of 10), gait problem, joint swelling, myalgias, neck pain and neck stiffness.  Skin: Negative.  Negative for color change, pallor, rash and  wound.  Allergic/Immunologic: Negative for environmental allergies, food allergies and immunocompromised state.  Neurological: Positive for numbness. Negative for dizziness, tremors, seizures, syncope, facial asymmetry, speech difficulty, weakness, light-headedness and headaches.  Hematological: Negative for adenopathy. Does not bruise/bleed easily.  Psychiatric/Behavioral: Negative for agitation, behavioral problems, confusion, decreased concentration, dysphoric mood, hallucinations, self-injury, sleep disturbance and suicidal ideas. The patient is not nervous/anxious and is not hyperactive.      Objective: Vital Signs: BP 117/65 (BP Location: Left Arm, Patient Position: Sitting)   Pulse 83   Ht 5\' 7"  (1.702 m)   Wt 266 lb (120.7 kg)   BMI 41.66 kg/m   Physical Exam Constitutional:      Appearance: He is well-developed.  HENT:     Head: Normocephalic and atraumatic.  Eyes:     Pupils: Pupils are equal, round, and reactive to light.  Neck:     Musculoskeletal: Normal range of motion and neck supple.  Pulmonary:     Effort: Pulmonary effort is normal.     Breath sounds: Normal breath sounds.  Abdominal:     General: Bowel sounds are normal.     Palpations: Abdomen is soft.  Musculoskeletal:     Left knee: He exhibits no effusion.  Skin:    General: Skin is warm and dry.  Neurological:     Mental Status: He is alert and oriented to person, place, and time.  Psychiatric:        Behavior: Behavior normal.        Thought Content: Thought content normal.        Judgment: Judgment normal.     Left Knee Exam   Muscle Strength  The patient has normal left knee strength.  Tenderness  The patient is experiencing tenderness in the medial joint line, patella and medial retinaculum.  Range of Motion  Extension: -5  Flexion: 130   Tests  McMurray:  Medial - positive Lateral - negative Varus: positive Valgus: negative Lachman:  Anterior - negative    Posterior - negative  Drawer:  Anterior - negative     Posterior - negative Pivot shift: negative Patellar apprehension: negative  Other  Erythema: absent Scars: absent Sensation: normal Pulse: present Swelling: none Effusion: no effusion present  Comments:  Arthroscopy portals medial and lateral.   Back Exam   Tenderness  The patient is experiencing tenderness in the lumbar.  Range of Motion  Extension: abnormal  Flexion: abnormal  Lateral bend right: abnormal  Rotation right: abnormal  Rotation left: abnormal   Muscle Strength  Right Quadriceps:  5/5  Left Quadriceps:  5/5  Right Hamstrings:  5/5  Left Hamstrings:  5/5   Tests  Straight leg raise right: negative Straight leg raise left: negative  Reflexes  Patellar: 0/4 Achilles: 0/4  Other  Toe walk: normal Heel walk: normal Sensation: normal Gait: normal  Erythema: no back redness Scars: present      Specialty Comments:  No specialty comments available.  Imaging: No results found.   PMFS History: Patient Active Problem List  Diagnosis Date Noted  . Unilateral primary osteoarthritis, left knee 05/06/2019   History reviewed. No pertinent past medical history.  History reviewed. No pertinent family history.  History reviewed. No pertinent surgical history. Social History   Occupational History  . Not on file  Tobacco Use  . Smoking status: Never Smoker  . Smokeless tobacco: Current User    Types: Chew  Substance and Sexual Activity  . Alcohol use: Not Currently  . Drug use: Never  . Sexual activity: Not Currently

## 2019-07-17 NOTE — Patient Instructions (Addendum)
  Plan: Knee is suffering from osteoarthritis, only real proven treatments are Weight loss, NSIADs like diclofenac and exercise. Well padded shoes help. Ice the knee 2Knee is suffering from osteoarthritis, only real proven treatments are Weight loss,  NSIADs like diclofenac, motrin or alleve and exercise. Well padded shoes help. Ice the knee 2-3 times a day 15-20 mins at a time.-3 times a day 15-20 mins at a time. Hot showers in the AM.  Injection with steroid may be of benefit. Hemp CBD capsules, amazon.com 5,000-7,000 mg per bottle, 60 capsules per bottle, take one capsule twice a day. Cane in the left hand to use with left leg weight bearing. Follow-Up Instructions: No follow-ups on file.

## 2019-09-11 ENCOUNTER — Other Ambulatory Visit: Payer: Self-pay

## 2019-09-11 ENCOUNTER — Encounter: Payer: Self-pay | Admitting: Sports Medicine

## 2019-09-11 ENCOUNTER — Ambulatory Visit (INDEPENDENT_AMBULATORY_CARE_PROVIDER_SITE_OTHER): Payer: Medicare Other | Admitting: Sports Medicine

## 2019-09-11 DIAGNOSIS — E1140 Type 2 diabetes mellitus with diabetic neuropathy, unspecified: Secondary | ICD-10-CM | POA: Diagnosis not present

## 2019-09-11 DIAGNOSIS — M79675 Pain in left toe(s): Secondary | ICD-10-CM | POA: Diagnosis not present

## 2019-09-11 DIAGNOSIS — M79674 Pain in right toe(s): Secondary | ICD-10-CM | POA: Diagnosis not present

## 2019-09-11 DIAGNOSIS — B351 Tinea unguium: Secondary | ICD-10-CM

## 2019-09-11 NOTE — Progress Notes (Signed)
Subjective: Alexander King is a 73 y.o. male patient with history of diabetes who return to office today complaining of long,mildly painful nails while ambulating in shoes; unable to trim. Patient states that the glucose reading this morning was 96mg /dl, last A1c was 7.1 and last visit to PCP Dr. Humphrey Rolls was in Sept. Reports that his knee surgery had to be postponed due to a UTI. Patient denies any new changes in medication except antibiotic. No new issues.   Patient Active Problem List   Diagnosis Date Noted  . Unilateral primary osteoarthritis, left knee 05/06/2019   Current Outpatient Medications on File Prior to Visit  Medication Sig Dispense Refill  . acetaminophen (TYLENOL) 500 MG tablet Take 500 mg by mouth every 6 (six) hours as needed.    Marland Kitchen amLODipine (NORVASC) 5 MG tablet     . aspirin EC 81 MG tablet Take 81 mg by mouth daily.    Marland Kitchen azelastine (OPTIVAR) 0.05 % ophthalmic solution     . betaxolol (KERLONE) 10 MG tablet     . Cholecalciferol (VITAMIN D-1000 MAX ST) 25 MCG (1000 UT) tablet Take by mouth.    . clobetasol cream (TEMOVATE) 0.05 % Apply topically.    . Cyanocobalamin (B-12) 2500 MCG TABS Take by mouth.    . diclofenac sodium (VOLTAREN) 1 % GEL     . Docusate Sodium 100 MG capsule Take 100 mg by mouth 2 (two) times daily.    . famotidine (PEPCID) 20 MG tablet     . fluticasone (FLONASE) 50 MCG/ACT nasal spray INSTILL 2 SPRAYS IN EACH NOSTRIL ONCE D    . furosemide (LASIX) 20 MG tablet Take 20 mg by mouth.    . gabapentin (NEURONTIN) 600 MG tablet     . HUMALOG KWIKPEN 100 UNIT/ML KwikPen     . hydrocortisone 2.5 % cream Apply topically 2 (two) times daily.    . Lactobacillus-Inulin ([place] PO) Take by mouth.    . lovastatin (MEVACOR) 40 MG tablet     . mupirocin ointment (BACTROBAN) 2 %     . NOVOFINE PLUS 32G X 4 MM MISC USE 6 TIMES DAILY AS DIRECTED    . nystatin cream (MYCOSTATIN)     . NYSTATIN powder     . Omega-3 Fatty Acids (FISH OIL) 1360 MG  CAPS Take by mouth.    Marland Kitchen omeprazole (PRILOSEC) 40 MG capsule     . ONETOUCH ULTRA test strip 2 (two) times daily. use for testing    . solifenacin (VESICARE) 10 MG tablet TK 1 T PO D    . TOUJEO SOLOSTAR 300 UNIT/ML SOPN     . VICTOZA 18 MG/3ML SOPN      No current facility-administered medications on file prior to visit.   Allergies  Allergen Reactions  . Sulfamethoxazole-Trimethoprim Hives, Itching and Rash    HIVES AND ITCHING HIVES AND ITCHING     No results found for this or any previous visit (from the past 2160 hour(s)).  Objective: General: Patient is awake, alert, and oriented x 3 and in no acute distress.  Integument: Skin is warm, dry and supple bilateral. Nails are tender, long, thickened and dystrophic with subungual debris, consistent with onychomycosis, 1-5 bilateral. No signs of infection. No open lesions or preulcerative lesions present bilateral. Remaining integument unremarkable.  Vasculature:  Dorsalis Pedis pulse 1/4 bilateral. Posterior Tibial pulse  1/4 bilateral. Capillary fill time <3 sec 1-5 bilateral. Positive hair growth to the level of the digits.Temperature gradient within  normal limits. No varicosities present bilateral. No edema present bilateral.   Neurology: The patient has intact sensation measured with a 5.07/10g Semmes Weinstein Monofilament at all pedal sites bilateral . Vibratory sensation diminished bilateral with tuning fork. No Babinski sign present bilateral.   Musculoskeletal: No symptomatic pedal deformities noted bilateral. Muscular strength 5/5 in all lower extremity muscular groups bilateral without pain on range of motion . No tenderness with calf compression bilateral.  Assessment and Plan: Problem List Items Addressed This Visit    None    Visit Diagnoses    Pain due to onychomycosis of toenails of both feet    -  Primary   Type 2 diabetes mellitus with diabetic neuropathy, without long-term current use of insulin ([place])          -Examined patient. -Re-discussed and educated patient on diabetic foot care, especially with  regards to the vascular, neurological and musculoskeletal systems.  -Mechanically debrided all nails 1-5 bilateral using sterile nail nipper and filed with dremel without incident  -Answered all patient questions -Patient to return  in 3 months for at risk foot care -Patient advised to call the office if any problems or questions arise in the meantime.  Landis Martins, DPM

## 2019-09-26 ENCOUNTER — Ambulatory Visit: Payer: Self-pay

## 2019-09-26 ENCOUNTER — Ambulatory Visit: Payer: Medicare Other | Admitting: Surgery

## 2019-09-26 ENCOUNTER — Other Ambulatory Visit: Payer: Self-pay

## 2019-09-26 ENCOUNTER — Encounter: Payer: Self-pay | Admitting: Surgery

## 2019-09-26 DIAGNOSIS — M1712 Unilateral primary osteoarthritis, left knee: Secondary | ICD-10-CM | POA: Diagnosis not present

## 2019-09-26 DIAGNOSIS — G8929 Other chronic pain: Secondary | ICD-10-CM | POA: Diagnosis not present

## 2019-09-26 DIAGNOSIS — M25562 Pain in left knee: Secondary | ICD-10-CM

## 2019-09-26 NOTE — Progress Notes (Signed)
   Office Visit Note   Patient: Alexander King           Date of Birth: [birth year]           MRN: [id number] Visit Date: 09/26/2019              Requested by: [name], MD [place],  [place] [address] PCP: [name], MD   Assessment & Plan: Visit Diagnoses:  1. Chronic pain of left knee   2. Arthritis of left knee     Plan: Patient's blood sugar in the clinic today was 240.  Advised him that I do not recommend doing an intra-articular Marcaine/Depo-Medrol injection.  Also advised him that best treatment option for his knee would be total knee replacement.  He has had some chronic issues with UTIs and this was discussed with Dr. Louanne Skye last office visit with him.  He is followed by urologist Dr. Darius Bump and has an appointment with him next month.  I advised patient to see if Dr. Felipa Eth would consider clearing him from a urology standpoint to undergo total knee replacement.  Patient will follow up with Dr. Louanne Skye in 6 weeks for recheck and we will see what happened with that urology appointment.  Patient again understands the risk of prosthetic knee infection with UTIs.  All questions answered.  Follow-Up Instructions: Return in about 6 weeks (around 11/07/2019) for with Dr Louanne Skye to discuss total knee replacement .   Orders:  Orders Placed This Encounter  Procedures  . XR KNEE 3 VIEW LEFT   No orders of the defined types were placed in this encounter.     Procedures: No procedures performed   Clinical Data: No additional findings.   Subjective: Chief Complaint  Patient presents with  . Left Knee - Pain    HPI 73 year old white male with history of end-stage DJD left knee comes in today requesting intra-articular Marcaine/Depo-Medrol injection.  Patient was last seen by me July 10, 2019 and I had referred him to Dr. Louanne Skye to discuss the possibility of doing left total knee replacement.  Patient has been having issues with chronic UTIs.   Scheduled to see urologist Dr. Carin Primrose Stoneking next month who has been following him for this.   Objective: Vital Signs: There were no vitals taken for this visit.  Physical Exam Fingerstick glucose in clinic today 240.  Gait is antalgic.  No acute distress. Ortho Exam  Specialty Comments:  No specialty comments available.  Imaging: No results found.   PMFS History: Patient Active Problem List   Diagnosis Date Noted  . Unilateral primary osteoarthritis, left knee 05/06/2019   History reviewed. No pertinent past medical history.  History reviewed. No pertinent family history.  History reviewed. No pertinent surgical history. Social History   Occupational History  . Not on file  Tobacco Use  . Smoking status: Never Smoker  . Smokeless tobacco: Current User    Types: Chew  Substance and Sexual Activity  . Alcohol use: Not Currently  . Drug use: Never  . Sexual activity: Not Currently

## 2019-10-04 DIAGNOSIS — N452 Orchitis: Secondary | ICD-10-CM | POA: Diagnosis not present

## 2019-10-04 DIAGNOSIS — N433 Hydrocele, unspecified: Secondary | ICD-10-CM | POA: Diagnosis not present

## 2019-10-08 DIAGNOSIS — N390 Urinary tract infection, site not specified: Secondary | ICD-10-CM | POA: Diagnosis not present

## 2019-10-08 DIAGNOSIS — Z79899 Other long term (current) drug therapy: Secondary | ICD-10-CM | POA: Diagnosis not present

## 2019-10-08 DIAGNOSIS — E782 Mixed hyperlipidemia: Secondary | ICD-10-CM | POA: Diagnosis not present

## 2019-10-08 DIAGNOSIS — E1165 Type 2 diabetes mellitus with hyperglycemia: Secondary | ICD-10-CM | POA: Diagnosis not present

## 2019-10-08 DIAGNOSIS — C61 Malignant neoplasm of prostate: Secondary | ICD-10-CM | POA: Diagnosis not present

## 2019-10-15 DIAGNOSIS — E1165 Type 2 diabetes mellitus with hyperglycemia: Secondary | ICD-10-CM | POA: Diagnosis not present

## 2019-10-15 DIAGNOSIS — N390 Urinary tract infection, site not specified: Secondary | ICD-10-CM | POA: Diagnosis not present

## 2019-10-15 DIAGNOSIS — B372 Candidiasis of skin and nail: Secondary | ICD-10-CM | POA: Diagnosis not present

## 2019-10-15 DIAGNOSIS — E1122 Type 2 diabetes mellitus with diabetic chronic kidney disease: Secondary | ICD-10-CM | POA: Diagnosis not present

## 2019-10-15 DIAGNOSIS — Z6841 Body Mass Index (BMI) 40.0 and over, adult: Secondary | ICD-10-CM | POA: Diagnosis not present

## 2019-10-15 DIAGNOSIS — E782 Mixed hyperlipidemia: Secondary | ICD-10-CM | POA: Diagnosis not present

## 2019-10-15 DIAGNOSIS — I10 Essential (primary) hypertension: Secondary | ICD-10-CM | POA: Diagnosis not present

## 2019-10-21 DIAGNOSIS — C61 Malignant neoplasm of prostate: Secondary | ICD-10-CM | POA: Diagnosis not present

## 2019-10-21 DIAGNOSIS — N319 Neuromuscular dysfunction of bladder, unspecified: Secondary | ICD-10-CM | POA: Diagnosis not present

## 2019-10-21 DIAGNOSIS — N3941 Urge incontinence: Secondary | ICD-10-CM | POA: Diagnosis not present

## 2019-10-21 DIAGNOSIS — Z87440 Personal history of urinary (tract) infections: Secondary | ICD-10-CM | POA: Diagnosis not present

## 2019-10-25 DIAGNOSIS — N390 Urinary tract infection, site not specified: Secondary | ICD-10-CM | POA: Diagnosis not present

## 2019-11-07 ENCOUNTER — Encounter: Payer: Self-pay | Admitting: Specialist

## 2019-11-07 ENCOUNTER — Ambulatory Visit (INDEPENDENT_AMBULATORY_CARE_PROVIDER_SITE_OTHER): Payer: Medicare Other | Admitting: Specialist

## 2019-11-07 ENCOUNTER — Other Ambulatory Visit: Payer: Self-pay

## 2019-11-07 VITALS — BP 114/57 | HR 70 | Ht 67.0 in | Wt 270.0 lb

## 2019-11-07 DIAGNOSIS — Z981 Arthrodesis status: Secondary | ICD-10-CM

## 2019-11-07 DIAGNOSIS — M419 Scoliosis, unspecified: Secondary | ICD-10-CM

## 2019-11-07 DIAGNOSIS — M1712 Unilateral primary osteoarthritis, left knee: Secondary | ICD-10-CM | POA: Diagnosis not present

## 2019-11-07 DIAGNOSIS — M4807 Spinal stenosis, lumbosacral region: Secondary | ICD-10-CM

## 2019-11-07 DIAGNOSIS — G8929 Other chronic pain: Secondary | ICD-10-CM

## 2019-11-07 DIAGNOSIS — M5442 Lumbago with sciatica, left side: Secondary | ICD-10-CM

## 2019-11-07 MED ORDER — TRAMADOL-ACETAMINOPHEN 37.5-325 MG PO TABS: 1.0000 | ORAL_TABLET | Freq: Four times a day (QID) | ORAL | 0 refills | Status: DC | PRN

## 2019-11-07 NOTE — Patient Instructions (Signed)
Plan: Plan: Knee is suffering from osteoarthritis, only real proven treatments are Weight loss, You can not take NSIADs like diclofenac orally and exercise. Well padded shoes help. Ice the is suffering from osteoarthritis, only real proven treatments are Weight loss, do not take NSIADs like diclofenac, motrin or alleve due to absence of one kidney and exercise. Well padded shoes help.Use a quad cane in the left hand.  Ice the knee 2-3 times a day 15-20 mins at a time.-3 times a day 15-20 mins at a time. Hot showers in the AM.  Injection with steroid may be of benefit. Hemp CBD capsules, amazon.com 5,000-7,000 mg per bottle, 60 capsules per bottle, take one capsule twice a day. Cane in the left hand to use with left leg weight bearing.

## 2019-11-07 NOTE — Addendum Note (Signed)
Addended by: Basil Dess on: 11/07/2019 09:47 AM   Modules accepted: Orders

## 2019-11-07 NOTE — Progress Notes (Signed)
Office Visit Note   Patient: Alexander King           Date of Birth: Apr 16, 1946           MRN: [id number] Visit Date: 11/07/2019              Requested by: [name], MD [place],  [place] [id number] PCP: [name], MD   Assessment & Plan: Visit Diagnoses:  1. Unilateral primary osteoarthritis, left knee   2. S/P lumbar fusion   3. Scoliosis of thoracolumbar spine, unspecified scoliosis type   4. Spinal stenosis of lumbosacral region   5. Chronic left-sided low back pain with left-sided sciatica     Plan: Knee is suffering from osteoarthritis, only real proven treatments are Weight loss, You can not take NSIADs like diclofenac orally and exercise. Well padded shoes help. Ice the is suffering from osteoarthritis, only real proven treatments are Weight loss, do not take NSIADs like diclofenac, motrin or alleve due to absence of one kidney and exercise. Well padded shoes help.Use a quad cane in the left hand.  Ice the knee 2-3 times a day 15-20 mins at a time.-3 times a day 15-20 mins at a time. Hot showers in the AM.  Injection with steroid may be of benefit. Hemp CBD capsules, amazon.com 5,000-7,000 mg per bottle, 60 capsules per bottle, take one capsule twice a day. Cane in the left hand to use with left leg weight bearing. Increased risk of infection due to self catheterization and prostate ca and recurrent UTIs. Follow-Up Instructions: No follow-ups on file.   Orders:  No orders of the defined types were placed in this encounter.  No orders of the defined types were placed in this encounter.     Procedures: No procedures performed   Clinical Data: No additional findings.   Subjective: Chief Complaint  Patient presents with  . Left Knee - Follow-up    Discuss LEFT TKA    74 year old male with historyof left knee pain since 2016. He has had multiple injections without relief and now with almost no improvement. History of diabetes type II,  has history of one kidney. He has been seen by Benjiman Core PA-C in 08/2019 and radiographs show severe osteoarthritis of the left knee and had a kidney removed due to pain and apparently the kidney evaluated and required a resection. He has been running a creatinine of 1.7. He has a history of chronic recurring UTIs and he has a history of prostate Ca and  He is on septra. He is concern that he is at the point where knee replacement is becoming more a need. He is on a self cath program and he has a risk that is greater for left infection post knee replacement.    Review of Systems  Constitutional: Negative.  Negative for activity change, appetite change, chills, diaphoresis, fatigue, fever and unexpected weight change.  HENT: Negative.   Eyes: Negative.   Cardiovascular: Negative.   Gastrointestinal: Negative.   Endocrine: Negative.   Genitourinary: Positive for difficulty urinating (Self catheterization post neurogenic bladder, 5 UTIs per year, prostate ca, single kidney previous nephrectomy for pain. ) and dysuria (Last UTI was 06/2019). Negative for decreased urine volume, discharge, enuresis, flank pain, frequency, genital sores, hematuria, penile pain, penile swelling, scrotal swelling, testicular pain and urgency.  Musculoskeletal: Positive for back pain. Negative for arthralgias, gait problem, joint swelling, myalgias, neck pain and neck stiffness.  Skin: Negative.  Negative for color change, pallor, rash and wound.  Neurological: Negative.  Negative for dizziness, tremors, seizures, syncope, facial asymmetry, speech difficulty, weakness, light-headedness, numbness and headaches.  Hematological: Negative.  Negative for adenopathy. Does not bruise/bleed easily.     Objective: Vital Signs: BP (!) 114/57 (BP Location: Left Arm, Patient Position: Sitting)   Pulse 70   Ht 5\' 7"  (1.702 m)   Wt 270 lb (122.5 kg)   BMI 42.29 kg/m   Physical Exam Constitutional:      Appearance: He is  well-developed.  HENT:     Head: Normocephalic and atraumatic.  Eyes:     Pupils: Pupils are equal, round, and reactive to light.  Pulmonary:     Effort: Pulmonary effort is normal.     Breath sounds: Normal breath sounds.  Abdominal:     General: Bowel sounds are normal.     Palpations: Abdomen is soft.  Musculoskeletal:     Cervical back: Normal range of motion and neck supple.     Left knee:     Instability Tests: Negative anterior drawer test. Negative posterior drawer test. Positive medial McMurray test. Negative lateral McMurray test.  Skin:    General: Skin is warm and dry.  Neurological:     Mental Status: He is alert and oriented to person, place, and time.  Psychiatric:        Behavior: Behavior normal.        Thought Content: Thought content normal.        Judgment: Judgment normal.     Left Knee Exam   Muscle Strength  The patient has normal left knee strength.  Tenderness  The patient is experiencing tenderness in the medial joint line, medial retinaculum and patella.  Range of Motion  Extension:  -10 abnormal  Flexion: 110   Tests  McMurray:  Medial - positive Lateral - negative Varus: positive Valgus: negative Lachman:  Anterior - negative    Posterior - negative Drawer:  Anterior - negative     Posterior - negative Pivot shift: negative Patellar apprehension: positive  Other  Erythema: absent Scars: absent Sensation: normal Pulse: present Swelling: mild  Comments:  Left varus knee, with minimal effusion. Pain with standing and walking, using a 4 prong cane for ambulation.       Specialty Comments:  No specialty comments available.  Imaging: No results found.   PMFS History: Patient Active Problem List   Diagnosis Date Noted  . Unilateral primary osteoarthritis, left knee 05/06/2019   History reviewed. No pertinent past medical history.  History reviewed. No pertinent family history.  History reviewed. No pertinent surgical  history. Social History   Occupational History  . Not on file  Tobacco Use  . Smoking status: Never Smoker  . Smokeless tobacco: Current User    Types: Chew  Substance and Sexual Activity  . Alcohol use: Not Currently  . Drug use: Never  . Sexual activity: Not Currently

## 2019-11-26 DIAGNOSIS — N390 Urinary tract infection, site not specified: Secondary | ICD-10-CM | POA: Diagnosis not present

## 2019-12-11 ENCOUNTER — Ambulatory Visit: Payer: Medicare Other | Admitting: Sports Medicine

## 2019-12-13 ENCOUNTER — Other Ambulatory Visit: Payer: Self-pay

## 2019-12-13 ENCOUNTER — Ambulatory Visit: Payer: Medicare PPO | Admitting: Sports Medicine

## 2019-12-13 ENCOUNTER — Encounter: Payer: Self-pay | Admitting: Sports Medicine

## 2019-12-13 DIAGNOSIS — E1140 Type 2 diabetes mellitus with diabetic neuropathy, unspecified: Secondary | ICD-10-CM

## 2019-12-13 DIAGNOSIS — B351 Tinea unguium: Secondary | ICD-10-CM | POA: Diagnosis not present

## 2019-12-13 DIAGNOSIS — M79674 Pain in right toe(s): Secondary | ICD-10-CM | POA: Diagnosis not present

## 2019-12-13 DIAGNOSIS — M79675 Pain in left toe(s): Secondary | ICD-10-CM

## 2019-12-13 NOTE — Progress Notes (Signed)
Subjective: Alexander King is a 74 y.o. male patient with history of diabetes who return to office today complaining of long,mildly painful nails while ambulating in shoes; unable to trim. Patient states that the glucose reading this morning was 128mg /dl, last A1c was 7.7 and last visit to PCP Dr. Humphrey Rolls was in Jan. No new issues   Patient Active Problem List   Diagnosis Date Noted  . Unilateral primary osteoarthritis, left knee 05/06/2019   Current Outpatient Medications on File Prior to Visit  Medication Sig Dispense Refill  . acetaminophen (TYLENOL) 500 MG tablet Take 500 mg by mouth every 6 (six) hours as needed.    Marland Kitchen amLODipine (NORVASC) 5 MG tablet     . aspirin EC 81 MG tablet Take 81 mg by mouth daily.    Marland Kitchen azelastine (OPTIVAR) 0.05 % ophthalmic solution     . betaxolol (KERLONE) 10 MG tablet     . Cholecalciferol (VITAMIN D-1000 MAX ST) 25 MCG (1000 UT) tablet Take by mouth.    . clobetasol cream (TEMOVATE) 0.05 % Apply topically.    . Cyanocobalamin (B-12) 2500 MCG TABS Take by mouth.    . diclofenac sodium (VOLTAREN) 1 % GEL     . Docusate Sodium 100 MG capsule Take 100 mg by mouth 2 (two) times daily.    . famotidine (PEPCID) 20 MG tablet     . fluticasone (FLONASE) 50 MCG/ACT nasal spray INSTILL 2 SPRAYS IN EACH NOSTRIL ONCE D    . furosemide (LASIX) 20 MG tablet Take 20 mg by mouth.    . gabapentin (NEURONTIN) 600 MG tablet     . HUMALOG KWIKPEN 100 UNIT/ML KwikPen     . hydrocortisone 2.5 % cream Apply topically 2 (two) times daily.    . Lactobacillus-Inulin ([place] PO) Take by mouth.    . lovastatin (MEVACOR) 40 MG tablet     . mupirocin ointment (BACTROBAN) 2 %     . NOVOFINE PLUS 32G X 4 MM MISC USE 6 TIMES DAILY AS DIRECTED    . nystatin cream (MYCOSTATIN)     . NYSTATIN powder     . Omega-3 Fatty Acids (FISH OIL) 1360 MG CAPS Take by mouth.    Marland Kitchen omeprazole (PRILOSEC) 40 MG capsule     . ONETOUCH ULTRA test strip 2 (two) times daily. use for  testing    . solifenacin (VESICARE) 10 MG tablet TK 1 T PO D    . TOUJEO SOLOSTAR 300 UNIT/ML SOPN     . traMADol-acetaminophen (ULTRACET) 37.5-325 MG tablet Take 1 tablet by mouth every 6 (six) hours as needed. 30 tablet 0  . VICTOZA 18 MG/3ML SOPN      No current facility-administered medications on file prior to visit.   Allergies  Allergen Reactions  . Sulfamethoxazole-Trimethoprim Hives, Itching and Rash    HIVES AND ITCHING HIVES AND ITCHING     No results found for this or any previous visit (from the past 2160 hour(s)).  Objective: General: Patient is awake, alert, and oriented x 3 and in no acute distress.  Integument: Skin is warm, dry and supple bilateral. Nails are tender, long, thickened and dystrophic with subungual debris, consistent with onychomycosis, 1-5 bilateral. No signs of infection. No open lesions or preulcerative lesions present bilateral. Remaining integument unremarkable.  Vasculature:  Dorsalis Pedis pulse 1/4 bilateral. Posterior Tibial pulse  1/4 bilateral. Capillary fill time <3 sec 1-5 bilateral. Positive hair growth to the level of the digits.Temperature gradient within normal  limits. No varicosities present bilateral. No edema present bilateral.   Neurology: The patient has intact sensation measured with a 5.07/10g Semmes Weinstein Monofilament at all pedal sites bilateral . Vibratory sensation diminished bilateral with tuning fork. No Babinski sign present bilateral.   Musculoskeletal: No symptomatic pedal deformities noted bilateral. Muscular strength 5/5 in all lower extremity muscular groups bilateral without pain on range of motion . No tenderness with calf compression bilateral.  Assessment and Plan: Problem List Items Addressed This Visit    None    Visit Diagnoses    Pain due to onychomycosis of toenails of both feet    -  Primary   Type 2 diabetes mellitus with diabetic neuropathy, without long-term current use of insulin ([place])          -Examined patient. -Re-discussed and educated patient on diabetic foot care, especially with  regards to the vascular, neurological and musculoskeletal systems.  -Mechanically debrided all nails 1-5 bilateral using sterile nail nipper and filed with dremel without incident  -Answered all patient questions -Patient to return  in 3 months for at risk foot care -Patient advised to call the office if any problems or questions arise in the meantime.  Landis Martins, DPM

## 2019-12-26 DIAGNOSIS — R339 Retention of urine, unspecified: Secondary | ICD-10-CM | POA: Diagnosis not present

## 2020-01-27 DIAGNOSIS — E119 Type 2 diabetes mellitus without complications: Secondary | ICD-10-CM | POA: Diagnosis not present

## 2020-02-11 DIAGNOSIS — C61 Malignant neoplasm of prostate: Secondary | ICD-10-CM | POA: Diagnosis not present

## 2020-02-11 DIAGNOSIS — E782 Mixed hyperlipidemia: Secondary | ICD-10-CM | POA: Diagnosis not present

## 2020-02-11 DIAGNOSIS — Z6841 Body Mass Index (BMI) 40.0 and over, adult: Secondary | ICD-10-CM | POA: Diagnosis not present

## 2020-02-11 DIAGNOSIS — J209 Acute bronchitis, unspecified: Secondary | ICD-10-CM | POA: Diagnosis not present

## 2020-02-11 DIAGNOSIS — E1165 Type 2 diabetes mellitus with hyperglycemia: Secondary | ICD-10-CM | POA: Diagnosis not present

## 2020-02-18 DIAGNOSIS — N183 Chronic kidney disease, stage 3 unspecified: Secondary | ICD-10-CM | POA: Diagnosis not present

## 2020-02-18 DIAGNOSIS — N312 Flaccid neuropathic bladder, not elsewhere classified: Secondary | ICD-10-CM | POA: Diagnosis not present

## 2020-02-18 DIAGNOSIS — E1129 Type 2 diabetes mellitus with other diabetic kidney complication: Secondary | ICD-10-CM | POA: Diagnosis not present

## 2020-02-18 DIAGNOSIS — N319 Neuromuscular dysfunction of bladder, unspecified: Secondary | ICD-10-CM | POA: Diagnosis not present

## 2020-02-18 DIAGNOSIS — K219 Gastro-esophageal reflux disease without esophagitis: Secondary | ICD-10-CM | POA: Diagnosis not present

## 2020-02-18 DIAGNOSIS — C61 Malignant neoplasm of prostate: Secondary | ICD-10-CM | POA: Diagnosis not present

## 2020-02-18 DIAGNOSIS — I10 Essential (primary) hypertension: Secondary | ICD-10-CM | POA: Diagnosis not present

## 2020-02-18 DIAGNOSIS — Z6841 Body Mass Index (BMI) 40.0 and over, adult: Secondary | ICD-10-CM | POA: Diagnosis not present

## 2020-02-18 DIAGNOSIS — N3941 Urge incontinence: Secondary | ICD-10-CM | POA: Diagnosis not present

## 2020-02-18 DIAGNOSIS — E782 Mixed hyperlipidemia: Secondary | ICD-10-CM | POA: Diagnosis not present

## 2020-02-18 DIAGNOSIS — J209 Acute bronchitis, unspecified: Secondary | ICD-10-CM | POA: Diagnosis not present

## 2020-02-18 DIAGNOSIS — Z87440 Personal history of urinary (tract) infections: Secondary | ICD-10-CM | POA: Diagnosis not present

## 2020-03-10 DIAGNOSIS — R339 Retention of urine, unspecified: Secondary | ICD-10-CM | POA: Diagnosis not present

## 2020-03-12 DIAGNOSIS — R3914 Feeling of incomplete bladder emptying: Secondary | ICD-10-CM | POA: Diagnosis not present

## 2020-03-12 DIAGNOSIS — N3941 Urge incontinence: Secondary | ICD-10-CM | POA: Diagnosis not present

## 2020-03-17 DIAGNOSIS — Z87440 Personal history of urinary (tract) infections: Secondary | ICD-10-CM | POA: Diagnosis not present

## 2020-03-17 DIAGNOSIS — C61 Malignant neoplasm of prostate: Secondary | ICD-10-CM | POA: Diagnosis not present

## 2020-03-17 DIAGNOSIS — N3941 Urge incontinence: Secondary | ICD-10-CM | POA: Diagnosis not present

## 2020-03-17 DIAGNOSIS — R8290 Unspecified abnormal findings in urine: Secondary | ICD-10-CM | POA: Diagnosis not present

## 2020-03-17 DIAGNOSIS — N319 Neuromuscular dysfunction of bladder, unspecified: Secondary | ICD-10-CM | POA: Diagnosis not present

## 2020-03-18 ENCOUNTER — Ambulatory Visit: Payer: Medicare PPO | Admitting: Sports Medicine

## 2020-03-19 ENCOUNTER — Ambulatory Visit (INDEPENDENT_AMBULATORY_CARE_PROVIDER_SITE_OTHER): Payer: Medicare PPO | Admitting: Podiatry

## 2020-03-19 ENCOUNTER — Other Ambulatory Visit: Payer: Self-pay

## 2020-03-19 ENCOUNTER — Encounter: Payer: Self-pay | Admitting: Podiatry

## 2020-03-19 DIAGNOSIS — M79675 Pain in left toe(s): Secondary | ICD-10-CM

## 2020-03-19 DIAGNOSIS — Z794 Long term (current) use of insulin: Secondary | ICD-10-CM | POA: Insufficient documentation

## 2020-03-19 DIAGNOSIS — L600 Ingrowing nail: Secondary | ICD-10-CM

## 2020-03-19 DIAGNOSIS — M79674 Pain in right toe(s): Secondary | ICD-10-CM

## 2020-03-19 DIAGNOSIS — E1140 Type 2 diabetes mellitus with diabetic neuropathy, unspecified: Secondary | ICD-10-CM

## 2020-03-19 DIAGNOSIS — B351 Tinea unguium: Secondary | ICD-10-CM

## 2020-03-19 NOTE — Progress Notes (Signed)
Subjective: Alexander King presents today preventative diabetic foot care and painful mycotic nails b/l that are difficult to trim. Pain interferes with ambulation. Aggravating factors include wearing enclosed shoe gear. Pain is relieved with periodic professional debridement.   Complaint of ingrown toenail right great toe; denies any redness, drainage or swelling of digit. Has attempted no treatment. Painful mostly when wearing enclosed shoe gear.  Alexander Flow, MD is patient's PCP.  Current Outpatient Medications on File Prior to Visit  Medication Sig Dispense Refill  . fexofenadine (ALLEGRA) 180 MG tablet TAKE 1 TABLET BY MOUTH DAILY FOR ALLERGIES    . acetaminophen (TYLENOL) 500 MG tablet Take 500 mg by mouth every 6 (six) hours as needed.    Marland Kitchen amLODipine (NORVASC) 5 MG tablet     . aspirin EC 81 MG tablet Take 81 mg by mouth daily.    Marland Kitchen azelastine (OPTIVAR) 0.05 % ophthalmic solution     . betaxolol (KERLONE) 10 MG tablet     . cefdinir (OMNICEF) 300 MG capsule     . Cholecalciferol (VITAMIN D-1000 MAX ST) 25 MCG (1000 UT) tablet Take by mouth.    . clobetasol cream (TEMOVATE) 0.05 % Apply topically.    . Cyanocobalamin (B-12) 2500 MCG TABS Take by mouth.    . diclofenac sodium (VOLTAREN) 1 % GEL     . Docusate Sodium 100 MG capsule Take 100 mg by mouth 2 (two) times daily.    . famotidine (PEPCID) 20 MG tablet     . fluticasone (FLONASE) 50 MCG/ACT nasal spray INSTILL 2 SPRAYS IN EACH NOSTRIL ONCE D    . furosemide (LASIX) 20 MG tablet Take 20 mg by mouth.    . gabapentin (NEURONTIN) 600 MG tablet     . HUMALOG KWIKPEN 100 UNIT/ML KwikPen     . hydrocortisone 2.5 % cream Apply topically 2 (two) times daily.    . Lactobacillus-Inulin (Alexander King PO) Take by mouth.    . lovastatin (MEVACOR) 40 MG tablet     . mupirocin ointment (BACTROBAN) 2 %     . NOVOFINE PLUS 32G X 4 MM MISC USE 6 TIMES DAILY AS DIRECTED    . nystatin cream (MYCOSTATIN)     . NYSTATIN powder      . Omega-3 Fatty Acids (FISH OIL) 1360 MG CAPS Take by mouth.    Marland Kitchen omeprazole (PRILOSEC) 40 MG capsule     . ONETOUCH ULTRA test strip 2 (two) times daily. use for testing    . solifenacin (VESICARE) 10 MG tablet TK 1 T PO D    . TOUJEO SOLOSTAR 300 UNIT/ML SOPN     . traMADol-acetaminophen (ULTRACET) 37.5-325 MG tablet Take 1 tablet by mouth every 6 (six) hours as needed. 30 tablet 0  . VICTOZA 18 MG/3ML SOPN      No current facility-administered medications on file prior to visit.     Allergies  Allergen Reactions  . Sulfamethoxazole-Trimethoprim Hives, Itching and Rash    HIVES AND ITCHING HIVES AND ITCHING     Objective: Alexander King is a pleasant 74 y.o. Caucasian male in NAD. AAO x 3.  There were no vitals filed for this visit.  Vascular Examination: Neurovascular status unchanged b/l lower extremities. Capillary fill time to digits <3 seconds b/l lower extremities. Faintly palpable pedal pulses b/l. Pedal hair present. Lower extremity skin temperature gradient within normal limits. No pain with calf compression b/l. No edema noted b/l lower extremities.  Dermatological Examination: Pedal skin with  normal turgor, texture and tone bilaterally. No open wounds bilaterally. No interdigital macerations bilaterally. Toenails 1-5 b/l elongated, discolored, dystrophic, thickened, crumbly with subungual debris and tenderness to dorsal palpation. Incurvated nailplate b/l border(s) L hallux and R hallux.  Nail border hypertrophy present. There is tenderness to palpation. Sign(s) of infection: no clinical signs of infection noted on examination today.  Musculoskeletal: Normal muscle strength 5/5 to all lower extremity muscle groups bilaterally. No pain crepitus or joint limitation noted with ROM b/l. No gross bony deformities bilaterally. Patient ambulates independent of any assistive aids.  Neurological Examination: Protective sensation intact 5/5 intact bilaterally with 10g  monofilament b/l. Vibratory sensation diminished b/l. Proprioception intact bilaterally. Babinski reflex negative b/l. Clonus negative b/l.  Assessment: 1. Pain due to onychomycosis of toenails of both feet   2. Ingrown toenail without infection   3. Type 2 diabetes mellitus with diabetic neuropathy, without long-term current use of insulin (Alexander King)   Plan: -Examined patient. -No new findings. No new orders. -Continue diabetic foot care principles. -Toenails 1-5 b/l were debrided in length and girth with sterile nail nippers and dremel without iatrogenic bleeding.  -Offending nail borders debrided and curretaged b/l great toes utilizing sterile nail nipper and currette. Borders cleansed with alcohol. Antibiotic ointment applied. No further treatment required by patient. -Patient to report any pedal injuries to medical professional immediately. -Patient to continue soft, supportive shoe gear daily. -Patient/POA to call should there be question/concern in the interim.  Return in about 3 months (around 06/19/2020).  Alexander King, DPM

## 2020-03-25 DIAGNOSIS — N390 Urinary tract infection, site not specified: Secondary | ICD-10-CM | POA: Diagnosis not present

## 2020-03-25 DIAGNOSIS — Z6841 Body Mass Index (BMI) 40.0 and over, adult: Secondary | ICD-10-CM | POA: Diagnosis not present

## 2020-03-25 DIAGNOSIS — N3941 Urge incontinence: Secondary | ICD-10-CM | POA: Diagnosis not present

## 2020-03-25 DIAGNOSIS — N183 Chronic kidney disease, stage 3 unspecified: Secondary | ICD-10-CM | POA: Diagnosis not present

## 2020-03-25 DIAGNOSIS — N318 Other neuromuscular dysfunction of bladder: Secondary | ICD-10-CM | POA: Diagnosis not present

## 2020-03-25 DIAGNOSIS — C61 Malignant neoplasm of prostate: Secondary | ICD-10-CM | POA: Diagnosis not present

## 2020-03-27 DIAGNOSIS — G9389 Other specified disorders of brain: Secondary | ICD-10-CM | POA: Diagnosis not present

## 2020-03-27 DIAGNOSIS — R509 Fever, unspecified: Secondary | ICD-10-CM | POA: Diagnosis not present

## 2020-03-27 DIAGNOSIS — J3489 Other specified disorders of nose and nasal sinuses: Secondary | ICD-10-CM | POA: Diagnosis not present

## 2020-03-27 DIAGNOSIS — R233 Spontaneous ecchymoses: Secondary | ICD-10-CM | POA: Diagnosis not present

## 2020-03-27 DIAGNOSIS — G319 Degenerative disease of nervous system, unspecified: Secondary | ICD-10-CM | POA: Diagnosis not present

## 2020-03-27 DIAGNOSIS — R748 Abnormal levels of other serum enzymes: Secondary | ICD-10-CM | POA: Diagnosis not present

## 2020-03-27 DIAGNOSIS — R944 Abnormal results of kidney function studies: Secondary | ICD-10-CM | POA: Diagnosis not present

## 2020-03-27 DIAGNOSIS — G3189 Other specified degenerative diseases of nervous system: Secondary | ICD-10-CM | POA: Diagnosis not present

## 2020-03-27 DIAGNOSIS — J320 Chronic maxillary sinusitis: Secondary | ICD-10-CM | POA: Diagnosis not present

## 2020-03-27 DIAGNOSIS — R7402 Elevation of levels of lactic acid dehydrogenase (LDH): Secondary | ICD-10-CM | POA: Diagnosis not present

## 2020-03-27 DIAGNOSIS — N319 Neuromuscular dysfunction of bladder, unspecified: Secondary | ICD-10-CM | POA: Diagnosis not present

## 2020-03-27 DIAGNOSIS — R42 Dizziness and giddiness: Secondary | ICD-10-CM | POA: Diagnosis not present

## 2020-04-06 DIAGNOSIS — Z6841 Body Mass Index (BMI) 40.0 and over, adult: Secondary | ICD-10-CM | POA: Diagnosis not present

## 2020-04-06 DIAGNOSIS — E1122 Type 2 diabetes mellitus with diabetic chronic kidney disease: Secondary | ICD-10-CM | POA: Diagnosis not present

## 2020-04-06 DIAGNOSIS — N390 Urinary tract infection, site not specified: Secondary | ICD-10-CM | POA: Diagnosis not present

## 2020-04-27 DIAGNOSIS — N189 Chronic kidney disease, unspecified: Secondary | ICD-10-CM | POA: Diagnosis not present

## 2020-04-27 DIAGNOSIS — D631 Anemia in chronic kidney disease: Secondary | ICD-10-CM | POA: Diagnosis not present

## 2020-04-27 DIAGNOSIS — R809 Proteinuria, unspecified: Secondary | ICD-10-CM | POA: Diagnosis not present

## 2020-04-27 DIAGNOSIS — N2581 Secondary hyperparathyroidism of renal origin: Secondary | ICD-10-CM | POA: Diagnosis not present

## 2020-05-03 IMAGING — MR MR PROSTATE WO/W CM
56 series · 56 of 56 positions shown · IV contrast (MULTIHANCE)
Comparison: None.

CLINICAL DATA: Prostate cancer diagnosed last [REDACTED].

EXAM:
MR PROSTATE WITHOUT AND WITH CONTRAST
TECHNIQUE: Multiplanar multisequence MRI images were obtained of the pelvis
centered about the prostate. Pre and post contrast images were
obtained.
CONTRAST:  10mL MULTIHANCE GADOBENATE DIMEGLUMINE 529 MG/ML IV SOLN
Creatinine was obtained on site at [HOSPITAL] at [HOSPITAL].
Results: Creatinine 1.9 mg/dL.

[Series 3: T1 · axial · 8.0mm · 1.06mm/px · 1 of 28 slices shown (1 of 2)]
[im 1/28]
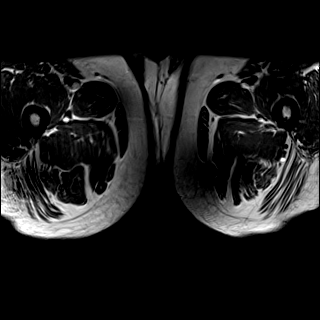

[Series 4: bSSFP fat-sat · axial · 8.0mm · 0.74mm/px · 1 of 28 slices shown]
[im 1/28]
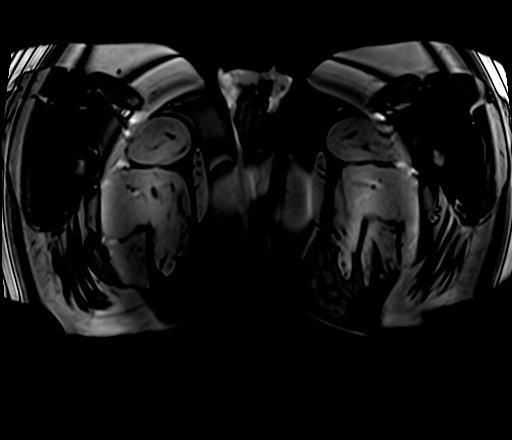

[Series 5: T2 · sagittal · 3.5mm · 0.56mm/px · 1 of 39 slices shown (1 of 4)]
[im 1/39]
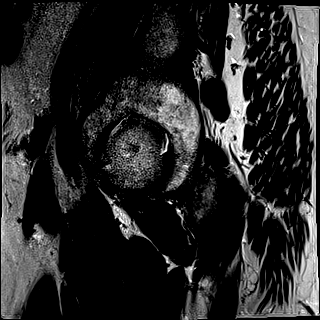

[Series 6: T1 · axial · 3.0mm · 0.31mm/px · 1 of 27 slices shown (2 of 2)]
[im 1/27]
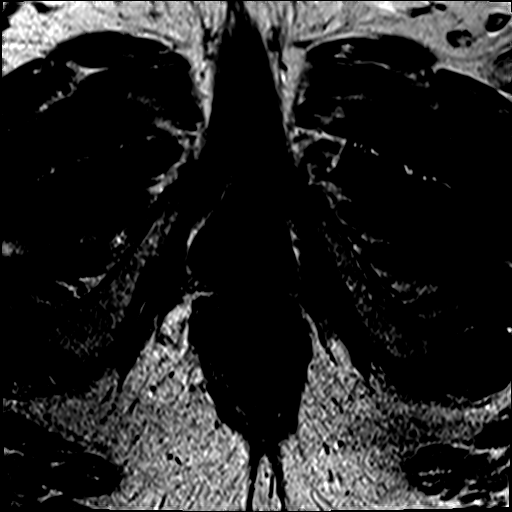

[Series 7: T2 · axial · 3.5mm · 0.56mm/px · 1 of 26 slices shown (2 of 4)]
[im 1/26]
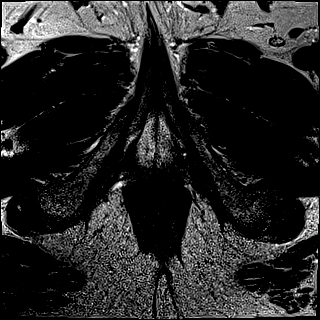

[Series 8: T2 · axial · 1.0mm · 1.04mm/px · 1 of 88 slices shown (3 of 4)]
[im 1/88]
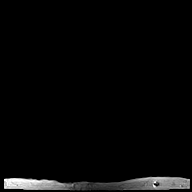

[Series 9: T2 · coronal · 3.5mm · 0.56mm/px · 1 of 23 slices shown (4 of 4)]
[im 1/23]
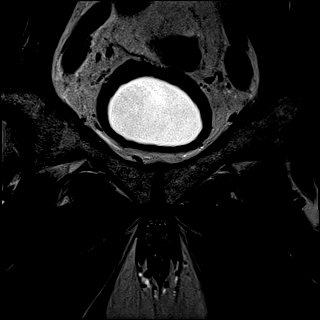

[Series 10: DWI · axial · 3.5mm · 1.56mm/px · 1 of 60 slices shown (1 of 2)]
[im 1/60]
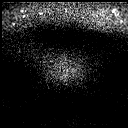

[Series 11: DWI · axial · 3.5mm · 1.56mm/px · 1 of 20 slices shown (2 of 2)]
[im 1/20]
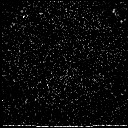

[Series 12: pre t1_twist_tra_dyn_ttc=6.4s · axial · non-contrast · 3.5mm · 0.83mm/px · 1 of 24 slices shown]
[im 1/24]
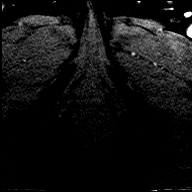

[Series 13: post t1_twist_tra_dyn-copy center · axial · 3.5mm · 0.83mm/px · 1 of 24 slices shown (1 of 24)]
[im 1/24]
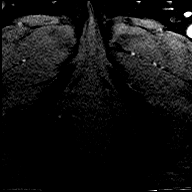

[Series 14: post t1_twist_tra_dyn-copy center · axial · 3.5mm · 0.83mm/px · 1 of 24 slices shown (2 of 24)]
[im 1/24]
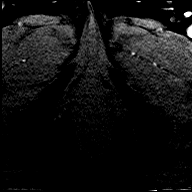

[Series 15: post t1_twist_tra_dyn-copy cent_sub_ttc=(id) · axial · 3.5mm · 0.83mm/px · 1 of 20 slices shown (1 of 22)]
[im 1/20]
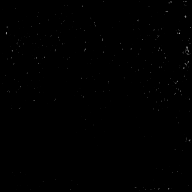

[Series 16: post t1_twist_tra_dyn-copy center · axial · 3.5mm · 0.83mm/px · 1 of 24 slices shown (3 of 24)]
[im 1/24]
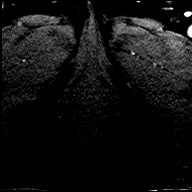

[Series 17: post t1_twist_tra_dyn-copy cent_sub_ttc=(id) · axial · 3.5mm · 0.83mm/px · 1 of 24 slices shown (2 of 22)]
[im 1/24]
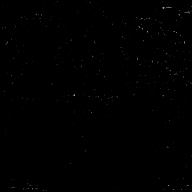

[Series 18: post t1_twist_tra_dyn-copy center · axial · 3.5mm · 0.83mm/px · 1 of 24 slices shown (4 of 24)]
[im 1/24]
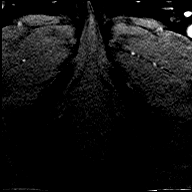

[Series 19: post t1_twist_tra_dyn-copy cent_sub_ttc=(id) · axial · 3.5mm · 0.83mm/px · 1 of 24 slices shown (3 of 22)]
[im 1/24]
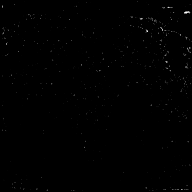

[Series 20: post t1_twist_tra_dyn-copy center · axial · 3.5mm · 0.83mm/px · 1 of 24 slices shown (5 of 24)]
[im 1/24]
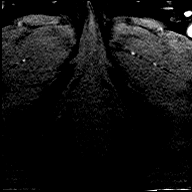

[Series 21: post t1_twist_tra_dyn-copy cent_sub_ttc=(id) · axial · 3.5mm · 0.83mm/px · 1 of 24 slices shown (4 of 22)]
[im 1/24]
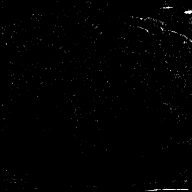

[Series 22: post t1_twist_tra_dyn-copy center · axial · 3.5mm · 0.83mm/px · 1 of 24 slices shown (6 of 24)]
[im 1/24]
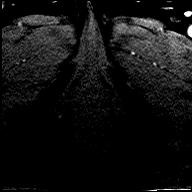

[Series 23: post t1_twist_tra_dyn-copy cent_sub_ttc=(id) · axial · 3.5mm · 0.83mm/px · 1 of 24 slices shown (5 of 22)]
[im 1/24]
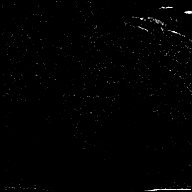

[Series 24: post t1_twist_tra_dyn-copy center · axial · 3.5mm · 0.83mm/px · 1 of 24 slices shown (7 of 24)]
[im 1/24]
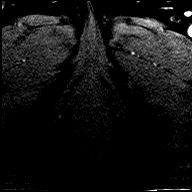

[Series 25: post t1_twist_tra_dyn-copy cent_sub_ttc=(id) · axial · 3.5mm · 0.83mm/px · 1 of 24 slices shown (6 of 22)]
[im 1/24]
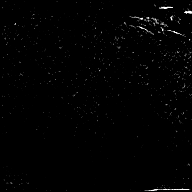

[Series 26: post t1_twist_tra_dyn-copy center · axial · 3.5mm · 0.83mm/px · 1 of 24 slices shown (8 of 24)]
[im 1/24]
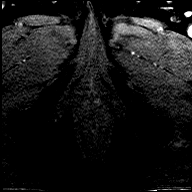

[Series 27: post t1_twist_tra_dyn-copy cent_sub_ttc=(id) · axial · 3.5mm · 0.83mm/px · 1 of 24 slices shown (7 of 22)]
[im 1/24]
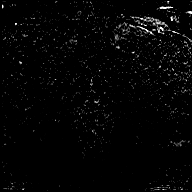

[Series 28: post t1_twist_tra_dyn-copy center · axial · 3.5mm · 0.83mm/px · 1 of 24 slices shown (9 of 24)]
[im 1/24]
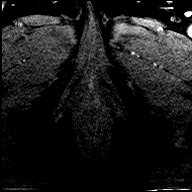

[Series 29: post t1_twist_tra_dyn-copy cent_sub_ttc=(id) · axial · 3.5mm · 0.83mm/px · 1 of 24 slices shown (8 of 22)]
[im 1/24]
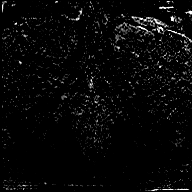

[Series 30: post t1_twist_tra_dyn-copy center · axial · 3.5mm · 0.83mm/px · 1 of 24 slices shown (10 of 24)]
[im 1/24]
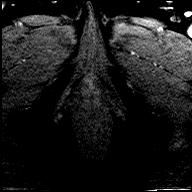

[Series 31: post t1_twist_tra_dyn-copy cent_sub_ttc=(id) · axial · 3.5mm · 0.83mm/px · 1 of 24 slices shown (9 of 22)]
[im 1/24]
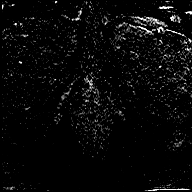

[Series 32: post t1_twist_tra_dyn-copy center · axial · 3.5mm · 0.83mm/px · 1 of 24 slices shown (11 of 24)]
[im 1/24]
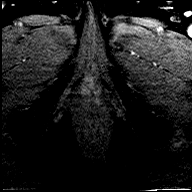

[Series 33: post t1_twist_tra_dyn-copy cent_sub_ttc=(id) · axial · 3.5mm · 0.83mm/px · 1 of 24 slices shown (10 of 22)]
[im 1/24]
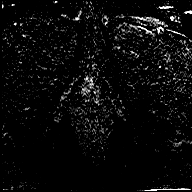

[Series 34: post t1_twist_tra_dyn-copy center · axial · 3.5mm · 0.83mm/px · 1 of 24 slices shown (12 of 24)]
[im 1/24]
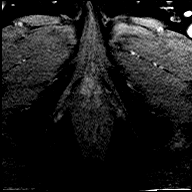

[Series 35: post t1_twist_tra_dyn-copy cent_sub_ttc=(id) · axial · 3.5mm · 0.83mm/px · 1 of 24 slices shown (11 of 22)]
[im 1/24]
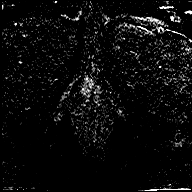

[Series 36: post t1_twist_tra_dyn-copy center · axial · 3.5mm · 0.83mm/px · 1 of 24 slices shown (13 of 24)]
[im 1/24]
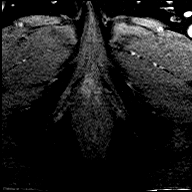

[Series 37: post t1_twist_tra_dyn-copy cent_sub_ttc=(id) · axial · 3.5mm · 0.83mm/px · 1 of 24 slices shown (12 of 22)]
[im 1/24]
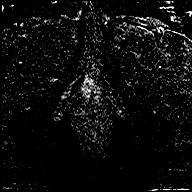

[Series 38: post t1_twist_tra_dyn-copy center · axial · 3.5mm · 0.83mm/px · 1 of 24 slices shown (14 of 24)]
[im 1/24]
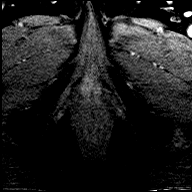

[Series 39: post t1_twist_tra_dyn-copy cent_sub_ttc=(id) · axial · 3.5mm · 0.83mm/px · 1 of 24 slices shown (13 of 22)]
[im 1/24]
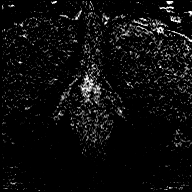

[Series 40: post t1_twist_tra_dyn-copy center · axial · 3.5mm · 0.83mm/px · 1 of 24 slices shown (15 of 24)]
[im 1/24]
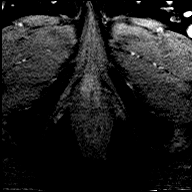

[Series 41: post t1_twist_tra_dyn-copy cent_sub_ttc=(id) · axial · 3.5mm · 0.83mm/px · 1 of 24 slices shown (14 of 22)]
[im 1/24]
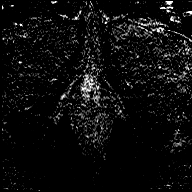

[Series 42: post t1_twist_tra_dyn-copy center · axial · 3.5mm · 0.83mm/px · 1 of 24 slices shown (16 of 24)]
[im 1/24]
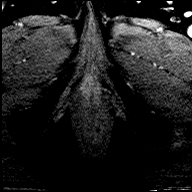

[Series 43: post t1_twist_tra_dyn-copy cent_sub_ttc=(id) · axial · 3.5mm · 0.83mm/px · 1 of 24 slices shown (15 of 22)]
[im 1/24]
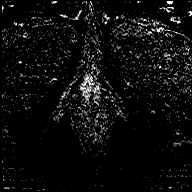

[Series 44: post t1_twist_tra_dyn-copy center · axial · 3.5mm · 0.83mm/px · 1 of 24 slices shown (17 of 24)]
[im 1/24]
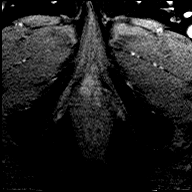

[Series 45: post t1_twist_tra_dyn-copy cent_sub_ttc=(id) · axial · 3.5mm · 0.83mm/px · 1 of 24 slices shown (16 of 22)]
[im 1/24]
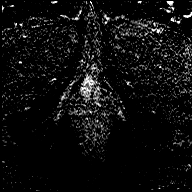

[Series 46: post t1_twist_tra_dyn-copy center · axial · 3.5mm · 0.83mm/px · 1 of 24 slices shown (18 of 24)]
[im 1/24]
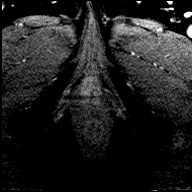

[Series 47: post t1_twist_tra_dyn-copy cent_sub_ttc=(id) · axial · 3.5mm · 0.83mm/px · 1 of 24 slices shown (17 of 22)]
[im 1/24]
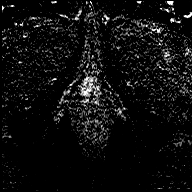

[Series 48: post t1_twist_tra_dyn-copy center · axial · 3.5mm · 0.83mm/px · 1 of 24 slices shown (19 of 24)]
[im 1/24]
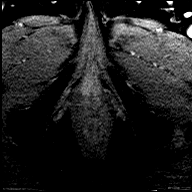

[Series 49: post t1_twist_tra_dyn-copy cent_sub_ttc=(id) · axial · 3.5mm · 0.83mm/px · 1 of 24 slices shown (18 of 22)]
[im 1/24]
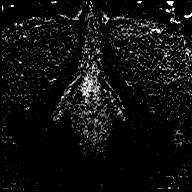

[Series 50: post t1_twist_tra_dyn-copy center · axial · 3.5mm · 0.83mm/px · 1 of 24 slices shown (20 of 24)]
[im 1/24]
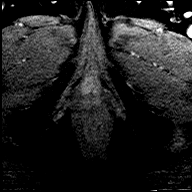

[Series 51: post t1_twist_tra_dyn-copy cent_sub_ttc=(id) · axial · 3.5mm · 0.83mm/px · 1 of 24 slices shown (19 of 22)]
[im 1/24]
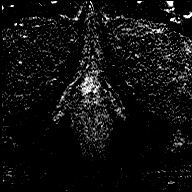

[Series 52: post t1_twist_tra_dyn-copy center · axial · 3.5mm · 0.83mm/px · 1 of 24 slices shown (21 of 24)]
[im 1/24]
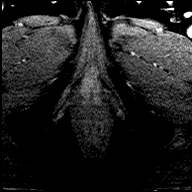

[Series 53: post t1_twist_tra_dyn-copy cent_sub_ttc=(id) · axial · 3.5mm · 0.83mm/px · 1 of 24 slices shown (20 of 22)]
[im 1/24]
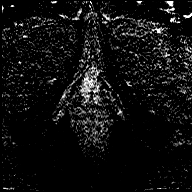

[Series 54: post t1_twist_tra_dyn-copy center · axial · 3.5mm · 0.83mm/px · 1 of 24 slices shown (22 of 24)]
[im 1/24]
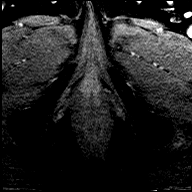

[Series 55: post t1_twist_tra_dyn-copy cent_sub_ttc=(id) · axial · 3.5mm · 0.83mm/px · 1 of 24 slices shown (21 of 22)]
[im 1/24]
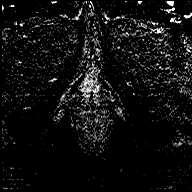

[Series 56: post t1_twist_tra_dyn-copy center · axial · 3.5mm · 0.83mm/px · 1 of 24 slices shown (23 of 24)]
[im 1/24]
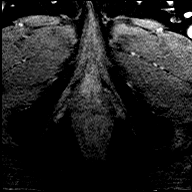

[Series 57: post t1_twist_tra_dyn-copy cent_sub_ttc=(id) · axial · 3.5mm · 0.83mm/px · 1 of 24 slices shown (22 of 22)]
[im 1/24]
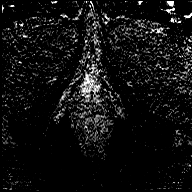

[Series 58: post t1_twist_tra_dyn-copy center · axial · 3.5mm · 0.83mm/px · 1 of 24 slices shown (24 of 24)]
[im 1/24]
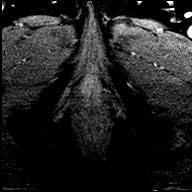

[56 of 56 positions shown; findings below may reference images not displayed]

FINDINGS: Prostate: Demonstrates moderate central gland enlargement and
heterogeneity, consistent with benign prostatic hyperplasia.

Compressed peripheral zone, without areas of masslike T2
hypointensity or restricted diffusion. Mild nonspecific, non
masslike early post-contrast enhancement within the left greater
than right mid peripheral zone on image 13/27 with mild extension in
the left apex on image 16/27.

Areas of ill-defined more focal T2 hypointensity within the central
gland. Example at the lateral right base, measuring 2.1 x 0.9 cm on
image [DATE].

A bilobed area of ill-defined T2 hypointensity within the central
left mid gland measures 2.2 x 2.7 cm on image [DATE]. Felt to be
contiguous with relatively diffuse marked T2 hypointensity within
the mid to apical central gland on image [DATE]. Measures on the order
of 2.8 x 1.9 cm on image [DATE] sagittal.

Volume: 4.4 x 4.6 x 4.7 cm (volume = 50 cm^3)

Transcapsular spread:  Absent

Seminal vesicle involvement: Absent

Neurovascular bundle involvement: Absent

Pelvic adenopathy: Absent

Bone metastasis: Absent. Lumbosacral spine fixation incidentally
noted.

Other findings: No significant free fluid.  Normal urinary bladder.
IMPRESSION: 1. No evidence of peripheral zone macroscopic or high-grade
carcinoma.
2. Multiple areas of central gland relatively ill-defined T2
hypointensity, as detailed above. Although morphology of these areas
is suspicious, the multiplicity suggests this may be all related to
benign prostatic hyperplasia. [REDACTED]

## 2020-05-06 DIAGNOSIS — N390 Urinary tract infection, site not specified: Secondary | ICD-10-CM | POA: Diagnosis not present

## 2020-05-06 DIAGNOSIS — Z6841 Body Mass Index (BMI) 40.0 and over, adult: Secondary | ICD-10-CM | POA: Diagnosis not present

## 2020-05-06 DIAGNOSIS — N481 Balanitis: Secondary | ICD-10-CM | POA: Diagnosis not present

## 2020-05-15 DIAGNOSIS — R339 Retention of urine, unspecified: Secondary | ICD-10-CM | POA: Diagnosis not present

## 2020-05-20 DIAGNOSIS — C61 Malignant neoplasm of prostate: Secondary | ICD-10-CM | POA: Diagnosis not present

## 2020-05-20 DIAGNOSIS — N390 Urinary tract infection, site not specified: Secondary | ICD-10-CM | POA: Diagnosis not present

## 2020-05-26 DIAGNOSIS — G4733 Obstructive sleep apnea (adult) (pediatric): Secondary | ICD-10-CM | POA: Diagnosis not present

## 2020-06-02 DIAGNOSIS — H6121 Impacted cerumen, right ear: Secondary | ICD-10-CM | POA: Diagnosis not present

## 2020-06-02 DIAGNOSIS — N138 Other obstructive and reflux uropathy: Secondary | ICD-10-CM | POA: Diagnosis not present

## 2020-06-02 DIAGNOSIS — Z87440 Personal history of urinary (tract) infections: Secondary | ICD-10-CM | POA: Diagnosis not present

## 2020-06-02 DIAGNOSIS — C61 Malignant neoplasm of prostate: Secondary | ICD-10-CM | POA: Diagnosis not present

## 2020-06-02 DIAGNOSIS — N401 Enlarged prostate with lower urinary tract symptoms: Secondary | ICD-10-CM | POA: Diagnosis not present

## 2020-06-02 DIAGNOSIS — N319 Neuromuscular dysfunction of bladder, unspecified: Secondary | ICD-10-CM | POA: Diagnosis not present

## 2020-06-02 DIAGNOSIS — N390 Urinary tract infection, site not specified: Secondary | ICD-10-CM | POA: Diagnosis not present

## 2020-06-02 DIAGNOSIS — N3941 Urge incontinence: Secondary | ICD-10-CM | POA: Diagnosis not present

## 2020-06-02 DIAGNOSIS — Z6841 Body Mass Index (BMI) 40.0 and over, adult: Secondary | ICD-10-CM | POA: Diagnosis not present

## 2020-06-17 ENCOUNTER — Ambulatory Visit: Payer: Medicare PPO | Admitting: Physical Medicine and Rehabilitation

## 2020-06-17 DIAGNOSIS — N401 Enlarged prostate with lower urinary tract symptoms: Secondary | ICD-10-CM | POA: Insufficient documentation

## 2020-06-24 DIAGNOSIS — Z87440 Personal history of urinary (tract) infections: Secondary | ICD-10-CM | POA: Diagnosis not present

## 2020-06-24 DIAGNOSIS — Q600 Renal agenesis, unilateral: Secondary | ICD-10-CM | POA: Diagnosis not present

## 2020-06-24 DIAGNOSIS — N183 Chronic kidney disease, stage 3 unspecified: Secondary | ICD-10-CM | POA: Diagnosis not present

## 2020-06-24 DIAGNOSIS — IMO0002 Reserved for concepts with insufficient information to code with codable children: Secondary | ICD-10-CM | POA: Insufficient documentation

## 2020-06-24 DIAGNOSIS — N390 Urinary tract infection, site not specified: Secondary | ICD-10-CM | POA: Diagnosis not present

## 2020-06-24 DIAGNOSIS — E1122 Type 2 diabetes mellitus with diabetic chronic kidney disease: Secondary | ICD-10-CM | POA: Diagnosis not present

## 2020-06-24 DIAGNOSIS — N138 Other obstructive and reflux uropathy: Secondary | ICD-10-CM | POA: Diagnosis not present

## 2020-06-24 DIAGNOSIS — N319 Neuromuscular dysfunction of bladder, unspecified: Secondary | ICD-10-CM | POA: Diagnosis not present

## 2020-06-24 DIAGNOSIS — N401 Enlarged prostate with lower urinary tract symptoms: Secondary | ICD-10-CM | POA: Diagnosis not present

## 2020-06-24 DIAGNOSIS — C61 Malignant neoplasm of prostate: Secondary | ICD-10-CM | POA: Diagnosis not present

## 2020-07-02 ENCOUNTER — Ambulatory Visit: Payer: Medicare PPO | Admitting: Podiatry

## 2020-07-02 ENCOUNTER — Encounter: Payer: Self-pay | Admitting: Podiatry

## 2020-07-02 ENCOUNTER — Other Ambulatory Visit: Payer: Self-pay

## 2020-07-02 DIAGNOSIS — E1140 Type 2 diabetes mellitus with diabetic neuropathy, unspecified: Secondary | ICD-10-CM | POA: Diagnosis not present

## 2020-07-02 DIAGNOSIS — M79674 Pain in right toe(s): Secondary | ICD-10-CM | POA: Diagnosis not present

## 2020-07-02 DIAGNOSIS — M79675 Pain in left toe(s): Secondary | ICD-10-CM | POA: Diagnosis not present

## 2020-07-02 DIAGNOSIS — B351 Tinea unguium: Secondary | ICD-10-CM | POA: Diagnosis not present

## 2020-07-02 DIAGNOSIS — C61 Malignant neoplasm of prostate: Secondary | ICD-10-CM | POA: Diagnosis not present

## 2020-07-02 NOTE — Progress Notes (Signed)
Subjective: Alexander King presents today preventative diabetic foot care and painful mycotic nails b/l that are difficult to trim. Pain interferes with ambulation. Aggravating factors include wearing enclosed shoe gear. Pain is relieved with periodic professional debridement.   Complaint of ingrown toenail right great toe; denies any redness, drainage or swelling of digit. Has attempted no treatment. Painful mostly when wearing enclosed shoe gear.  Would also like to know about diabetic shoes.  He would like to know about changing the dosage times for his gabapentin.   [name], MD is patient's PCP. Last visit was August, 2021.  Current Outpatient Medications on File Prior to Visit  Medication Sig Dispense Refill  . acetaminophen (TYLENOL) 500 MG tablet Take 500 mg by mouth every 6 (six) hours as needed.    Marland Kitchen amLODipine (NORVASC) 5 MG tablet     . aspirin EC 81 MG tablet Take 81 mg by mouth daily.    Marland Kitchen azelastine (OPTIVAR) 0.05 % ophthalmic solution     . betaxolol (KERLONE) 10 MG tablet     . cefdinir (OMNICEF) 300 MG capsule     . Cholecalciferol (VITAMIN D-1000 MAX ST) 25 MCG (1000 UT) tablet Take by mouth.    . clobetasol cream (TEMOVATE) 0.05 % Apply topically.    . Cyanocobalamin (B-12) 2500 MCG TABS Take by mouth.    . diclofenac sodium (VOLTAREN) 1 % GEL     . Docusate Sodium 100 MG capsule Take 100 mg by mouth 2 (two) times daily.    . famotidine (PEPCID) 20 MG tablet     . fexofenadine (ALLEGRA) 180 MG tablet TAKE 1 TABLET BY MOUTH DAILY FOR ALLERGIES    . fluticasone (FLONASE) 50 MCG/ACT nasal spray INSTILL 2 SPRAYS IN EACH NOSTRIL ONCE D    . furosemide (LASIX) 20 MG tablet Take 20 mg by mouth.    . gabapentin (NEURONTIN) 600 MG tablet     . HUMALOG KWIKPEN 100 UNIT/ML KwikPen     . hydrocortisone 2.5 % cream Apply topically 2 (two) times daily.    . Lactobacillus-Inulin ([place] PO) Take by mouth.    . lovastatin (MEVACOR) 40 MG tablet     .  mupirocin ointment (BACTROBAN) 2 %     . NOVOFINE PLUS 32G X 4 MM MISC USE 6 TIMES DAILY AS DIRECTED    . nystatin cream (MYCOSTATIN)     . NYSTATIN powder     . Omega-3 Fatty Acids (FISH OIL) 1360 MG CAPS Take by mouth.    Marland Kitchen omeprazole (PRILOSEC) 40 MG capsule     . ONETOUCH ULTRA test strip 2 (two) times daily. use for testing    . solifenacin (VESICARE) 10 MG tablet TK 1 T PO D    . TOUJEO SOLOSTAR 300 UNIT/ML SOPN     . traMADol-acetaminophen (ULTRACET) 37.5-325 MG tablet Take 1 tablet by mouth every 6 (six) hours as needed. 30 tablet 0  . VICTOZA 18 MG/3ML SOPN      No current facility-administered medications on file prior to visit.     Allergies  Allergen Reactions  . Sulfamethoxazole-Trimethoprim Hives, Itching and Rash    HIVES AND ITCHING HIVES AND ITCHING     Objective: Alexander King is a pleasant 74 y.o. Caucasian male in NAD. AAO x 3.  There were no vitals filed for this visit.  Vascular Examination: Neurovascular status unchanged b/l lower extremities. Capillary fill time to digits <3 seconds b/l lower extremities. Faintly palpable pedal pulses b/l.  Pedal hair present. Lower extremity skin temperature gradient within normal limits. No pain with calf compression b/l. No edema noted b/l lower extremities.  Dermatological Examination: Pedal skin with normal turgor, texture and tone bilaterally. No open wounds bilaterally. No interdigital macerations bilaterally. Toenails 1-5 b/l elongated, discolored, dystrophic, thickened, crumbly with subungual debris and tenderness to dorsal palpation. Incurvated nailplate b/l border(s) L hallux and R hallux.  Nail border hypertrophy present. There is tenderness to palpation. Sign(s) of infection: no clinical signs of infection noted on examination today.  Musculoskeletal: Normal muscle strength 5/5 to all lower extremity muscle groups bilaterally. No pain crepitus or joint limitation noted with ROM b/l. No gross bony deformities  bilaterally. Patient ambulates independent of any assistive aids.  Neurological Examination: Pt has subjective symptoms of neuropathy. Protective sensation intact 5/5 intact bilaterally with 10g monofilament b/l. Vibratory sensation diminished b/l. Proprioception intact bilaterally. Babinski reflex negative b/l. Clonus negative b/l.  Assessment: 1. Pain due to onychomycosis of toenails of both feet   2. Type 2 diabetes mellitus with diabetic neuropathy, without long-term current use of insulin ([place])     Plan: -Examined patient. -No new findings. No new orders. -Advised Alexander King to call the prescriber's office regarding dosage/times of gabapentin. He related understanding. -Continue diabetic foot care principles. -Toenails 1-5 b/l were debrided in length and girth with sterile nail nippers and dremel without iatrogenic bleeding.  -Offending nail borders debrided and curretaged b/l great toes utilizing sterile nail nipper and currette. No further treatment required by patient. -Patient to report any pedal injuries to medical professional immediately. -Patient to continue soft, supportive shoe gear daily. -Patient/POA to call should there be question/concern in the interim.  Return in about 3 months (around 10/02/2020) for diabetic foot care, 3 month toenail debridement.  Marzetta Board, DPM

## 2020-07-07 ENCOUNTER — Other Ambulatory Visit: Payer: Self-pay

## 2020-07-07 ENCOUNTER — Ambulatory Visit: Payer: Medicare PPO | Admitting: Orthotics

## 2020-07-07 DIAGNOSIS — E1140 Type 2 diabetes mellitus with diabetic neuropathy, unspecified: Secondary | ICD-10-CM

## 2020-07-07 NOTE — Progress Notes (Signed)

## 2020-07-13 DIAGNOSIS — C61 Malignant neoplasm of prostate: Secondary | ICD-10-CM | POA: Diagnosis not present

## 2020-07-14 DIAGNOSIS — N401 Enlarged prostate with lower urinary tract symptoms: Secondary | ICD-10-CM | POA: Diagnosis not present

## 2020-07-14 DIAGNOSIS — R8290 Unspecified abnormal findings in urine: Secondary | ICD-10-CM | POA: Diagnosis not present

## 2020-07-14 DIAGNOSIS — C61 Malignant neoplasm of prostate: Secondary | ICD-10-CM | POA: Diagnosis not present

## 2020-07-14 DIAGNOSIS — N3941 Urge incontinence: Secondary | ICD-10-CM | POA: Diagnosis not present

## 2020-07-14 DIAGNOSIS — Z87440 Personal history of urinary (tract) infections: Secondary | ICD-10-CM | POA: Diagnosis not present

## 2020-07-14 DIAGNOSIS — N390 Urinary tract infection, site not specified: Secondary | ICD-10-CM | POA: Diagnosis not present

## 2020-07-14 DIAGNOSIS — N319 Neuromuscular dysfunction of bladder, unspecified: Secondary | ICD-10-CM | POA: Diagnosis not present

## 2020-07-14 DIAGNOSIS — B9562 Methicillin resistant Staphylococcus aureus infection as the cause of diseases classified elsewhere: Secondary | ICD-10-CM | POA: Diagnosis not present

## 2020-07-14 DIAGNOSIS — N138 Other obstructive and reflux uropathy: Secondary | ICD-10-CM | POA: Diagnosis not present

## 2020-07-16 ENCOUNTER — Telehealth: Payer: Self-pay | Admitting: Podiatry

## 2020-07-16 NOTE — Telephone Encounter (Signed)
Called pt per Dr Elisha Ponder to discuss diabetic shoes. Per Medicare pt has to have diabetes and an additional foot condition for insurance to pay for the shoes/inserts. In the notes pt only has diabetes and no other foot condition that would qualify pt. I explained that at his next appt Dr Elisha Ponder would do the diabetic foot exam and make sure pt qualifies and we could order shoes at that time. Pt was not happy and stated he is diabetic and has a issue with the bottom of his feet.  He then asked what the cost of the shoes were and I told him 250.00 for 1pr of shoes and 1pr of custom inserts and he said it was too much. He said that he was going else ware to get diabetic shoes because it was too much hassle.I apologized.

## 2020-07-29 DIAGNOSIS — D631 Anemia in chronic kidney disease: Secondary | ICD-10-CM | POA: Diagnosis not present

## 2020-07-29 DIAGNOSIS — R809 Proteinuria, unspecified: Secondary | ICD-10-CM | POA: Diagnosis not present

## 2020-07-29 DIAGNOSIS — N183 Chronic kidney disease, stage 3 unspecified: Secondary | ICD-10-CM | POA: Diagnosis not present

## 2020-07-29 DIAGNOSIS — I129 Hypertensive chronic kidney disease with stage 1 through stage 4 chronic kidney disease, or unspecified chronic kidney disease: Secondary | ICD-10-CM | POA: Diagnosis not present

## 2020-07-29 DIAGNOSIS — N184 Chronic kidney disease, stage 4 (severe): Secondary | ICD-10-CM | POA: Diagnosis not present

## 2020-07-30 ENCOUNTER — Encounter: Payer: Self-pay | Admitting: Podiatry

## 2020-07-30 ENCOUNTER — Ambulatory Visit: Payer: Medicare PPO | Admitting: Podiatry

## 2020-07-30 ENCOUNTER — Other Ambulatory Visit: Payer: Self-pay

## 2020-07-30 DIAGNOSIS — E119 Type 2 diabetes mellitus without complications: Secondary | ICD-10-CM | POA: Diagnosis not present

## 2020-07-30 DIAGNOSIS — M2042 Other hammer toe(s) (acquired), left foot: Secondary | ICD-10-CM | POA: Diagnosis not present

## 2020-07-30 DIAGNOSIS — M2011 Hallux valgus (acquired), right foot: Secondary | ICD-10-CM | POA: Diagnosis not present

## 2020-07-30 DIAGNOSIS — E1140 Type 2 diabetes mellitus with diabetic neuropathy, unspecified: Secondary | ICD-10-CM | POA: Diagnosis not present

## 2020-07-30 DIAGNOSIS — L84 Corns and callosities: Secondary | ICD-10-CM | POA: Diagnosis not present

## 2020-07-30 DIAGNOSIS — M2012 Hallux valgus (acquired), left foot: Secondary | ICD-10-CM

## 2020-07-30 DIAGNOSIS — M2041 Other hammer toe(s) (acquired), right foot: Secondary | ICD-10-CM | POA: Diagnosis not present

## 2020-07-30 NOTE — Progress Notes (Signed)
ANNUAL DIABETIC FOOT EXAM  Subjective: Alexander King presents today for for annual diabetic foot examination.  Patient denies any h/o foot wounds.  Patient does relate symptoms of foot tingling.  He is also here for need for diabetic shoes.  Alexander Flow, MD is patient's PCP. Last visit was   History reviewed. No pertinent past medical history.  Patient Active Problem List   Diagnosis Date Noted  . Solitary kidney 06/24/2020  . BPH with obstruction/lower urinary tract symptoms 06/17/2020  . Long-term insulin use in type 2 diabetes ([place]) 03/19/2020  . Unilateral primary osteoarthritis, left knee 05/06/2019  . Hyperlipidemia 06/26/2017  . Hypertension 06/26/2017  . Sleep apnea 06/26/2017  . Malignant neoplasm of prostate ([place]) 05/02/2017  . Tachycardia 06/12/2016  . Acute kidney injury (nontraumatic) ([place]) 06/09/2016  . Gross hematuria 06/09/2016  . Urinary tract infection with hematuria 06/09/2016  . Neurogenic bladder 06/08/2016  . Testicular lesion 06/08/2016  . Urge incontinence 06/08/2016  . Continuous leakage of urine 04/05/2016  . Lumbar radiculopathy 04/05/2016  . Chronic pain of left knee 07/14/2015  . Disc degeneration, lumbar 06/08/2015  . Osteoarthritis of spine with radiculopathy, lumbar region 06/08/2015  . Spondylolisthesis, lumbar region 06/08/2015    History reviewed. No pertinent surgical history.  Current Outpatient Medications on File Prior to Visit  Medication Sig Dispense Refill  . acetaminophen (TYLENOL) 500 MG tablet Take 500 mg by mouth every 6 (six) hours as needed.    Marland Kitchen amLODipine (NORVASC) 5 MG tablet     . aspirin EC 81 MG tablet Take 81 mg by mouth daily.    Marland Kitchen azelastine (OPTIVAR) 0.05 % ophthalmic solution     . betaxolol (KERLONE) 10 MG tablet     . cefdinir (OMNICEF) 300 MG capsule     . Cholecalciferol (VITAMIN D-1000 MAX ST) 25 MCG (1000 UT) tablet Take by mouth.    . ciprofloxacin (CIPRO) 250 MG tablet     . clobetasol cream  (TEMOVATE) 0.05 % Apply topically.    . Cyanocobalamin (B-12) 2500 MCG TABS Take by mouth.    . diclofenac sodium (VOLTAREN) 1 % GEL     . Docusate Sodium 100 MG capsule Take 100 mg by mouth 2 (two) times daily.    Marland Kitchen doxycycline (VIBRAMYCIN) 100 MG capsule     . famotidine (PEPCID) 20 MG tablet     . fexofenadine (ALLEGRA) 180 MG tablet TAKE 1 TABLET BY MOUTH DAILY FOR ALLERGIES    . fluticasone (FLONASE) 50 MCG/ACT nasal spray INSTILL 2 SPRAYS IN EACH NOSTRIL ONCE D    . furosemide (LASIX) 20 MG tablet Take 20 mg by mouth.    . gabapentin (NEURONTIN) 600 MG tablet     . HUMALOG KWIKPEN 100 UNIT/ML KwikPen     . hydrocortisone 2.5 % cream Apply topically 2 (two) times daily.    . Lactobacillus-Inulin ([place] PO) Take by mouth.    . lovastatin (MEVACOR) 40 MG tablet     . mupirocin ointment (BACTROBAN) 2 %     . nitrofurantoin, macrocrystal-monohydrate, (MACROBID) 100 MG capsule     . NOVOFINE PLUS 32G X 4 MM MISC USE 6 TIMES DAILY AS DIRECTED    . nystatin cream (MYCOSTATIN)     . NYSTATIN powder     . Omega-3 Fatty Acids (FISH OIL) 1360 MG CAPS Take by mouth.    Marland Kitchen omeprazole (PRILOSEC) 40 MG capsule     . ONETOUCH ULTRA test strip 2 (two) times daily. use  for testing    . pantoprazole (PROTONIX) 40 MG tablet     . solifenacin (VESICARE) 10 MG tablet TK 1 T PO D    . tamsulosin (FLOMAX) 0.4 MG CAPS capsule     . TOUJEO SOLOSTAR 300 UNIT/ML SOPN     . traMADol-acetaminophen (ULTRACET) 37.5-325 MG tablet Take 1 tablet by mouth every 6 (six) hours as needed. 30 tablet 0  . VICTOZA 18 MG/3ML SOPN      No current facility-administered medications on file prior to visit.     Allergies  Allergen Reactions  . Sulfamethoxazole-Trimethoprim Hives, Itching and Rash    HIVES AND ITCHING HIVES AND ITCHING     Social History   Occupational History  . Not on file  Tobacco Use  . Smoking status: Never Smoker  . Smokeless tobacco: Current User    Types: Chew   Substance and Sexual Activity  . Alcohol use: Not Currently  . Drug use: Never  . Sexual activity: Not Currently    History reviewed. No pertinent family history.   There is no immunization history on file for this patient.   Objective: There were no vitals filed for this visit.  Alexander King is a pleasant 74 y.o. male in NAD. AAO X 3.  Vascular Examination: Capillary fill time to digits <3 seconds b/l lower extremities. Faintly palpable pedal pulses b/l. Pedal hair present. Lower extremity skin temperature gradient within normal limits. No pain with calf compression b/l. No edema noted b/l lower extremities.  Dermatological Examination: Pedal skin with normal turgor, texture and tone bilaterally. No open wounds bilaterally. No interdigital macerations bilaterally. Toenails 1-5 b/l well maintained with adequate length. No erythema, no edema, no drainage, no fluctuance. Hyperkeratotic lesion(s) submet head 5 left foot and submet head 5 right foot.  No erythema, no edema, no drainage, no fluctuance.  Musculoskeletal Examination: Normal muscle strength 5/5 to all lower extremity muscle groups bilaterally. No pain crepitus or joint limitation noted with ROM b/l. Hallux valgus with bunion deformity noted b/l lower extremities. Hammertoes noted to the b/l lower extremities.  Footwear Assessment: Does the patient wear appropriate shoes? Yes.. Does the patient need inserts/orthotics? Yes.  Neurological Examination: Pt has subjective symptoms of neuropathy. Protective sensation intact 5/5 intact bilaterally with 10g monofilament b/l. Vibratory sensation diminished b/l.  No flowsheet data found.   Assessment: 1. Callus   2. Hallux valgus, acquired, bilateral   3. Acquired hammertoes of both feet   4. Type 2 diabetes mellitus with diabetic neuropathy, without long-term current use of insulin ([place])   5. Encounter for diabetic foot exam ([place])     ADA Risk Categorization: Low Risk :   Patient has all of the following: Intact protective sensation No prior foot ulcer  No severe deformity Pedal pulses present  Plan: -Examined patient. -Diabetic foot examination performed on today's visit. -Continue diabetic foot care principles. -Patient to continue soft, supportive shoe gear daily. Start procedure for diabetic shoes. Patient qualifies based on diagnoses. -Callus(es) submet head 5 left foot and submet head 5 right foot pared utilizing sterile scalpel blade without complication or incident. Total number debrided =2. -Patient/POA to call should there be question/concern in the interim.  Return in about 9 weeks (around 10/01/2020) for diabetic foot care.  Marzetta Board, DPM

## 2020-07-31 DIAGNOSIS — Z87440 Personal history of urinary (tract) infections: Secondary | ICD-10-CM | POA: Diagnosis not present

## 2020-08-03 DIAGNOSIS — R339 Retention of urine, unspecified: Secondary | ICD-10-CM | POA: Diagnosis not present

## 2020-08-05 DIAGNOSIS — Z87440 Personal history of urinary (tract) infections: Secondary | ICD-10-CM | POA: Diagnosis not present

## 2020-08-06 DIAGNOSIS — N390 Urinary tract infection, site not specified: Secondary | ICD-10-CM | POA: Diagnosis not present

## 2020-08-31 DIAGNOSIS — Z6841 Body Mass Index (BMI) 40.0 and over, adult: Secondary | ICD-10-CM | POA: Diagnosis not present

## 2020-08-31 DIAGNOSIS — J40 Bronchitis, not specified as acute or chronic: Secondary | ICD-10-CM | POA: Diagnosis not present

## 2020-08-31 DIAGNOSIS — N390 Urinary tract infection, site not specified: Secondary | ICD-10-CM | POA: Diagnosis not present

## 2020-08-31 DIAGNOSIS — J329 Chronic sinusitis, unspecified: Secondary | ICD-10-CM | POA: Diagnosis not present

## 2020-09-02 DIAGNOSIS — R339 Retention of urine, unspecified: Secondary | ICD-10-CM | POA: Diagnosis not present

## 2020-09-06 DIAGNOSIS — N3090 Cystitis, unspecified without hematuria: Secondary | ICD-10-CM | POA: Diagnosis not present

## 2020-09-06 DIAGNOSIS — N3001 Acute cystitis with hematuria: Secondary | ICD-10-CM | POA: Diagnosis not present

## 2020-09-07 ENCOUNTER — Telehealth: Payer: Self-pay | Admitting: Podiatry

## 2020-09-07 NOTE — Telephone Encounter (Signed)
Pt called asking to talk to the person who orders diabetic shoes. Pt is from [place] and seen Alexander King and was told there was no documentation in chart from our doctor that pt qualified for shoes. Pt did schedule an appt and seen Dr Alexander King and an exam was done on 11.4. Pt said he was told as long as the shoes were ordered by 12/15 they would come in this year. I explained that we have to have his doctor sign the paperwork stating he is diabetic and he stated no one told him a dam thing about that and that he does not think we are following protocol for our patients. He stated that he called his insurance and they told him his coverage may change for next year and we may have to eat the pair of dam shoes. He wants a call back today.

## 2020-09-08 DIAGNOSIS — G4733 Obstructive sleep apnea (adult) (pediatric): Secondary | ICD-10-CM | POA: Diagnosis not present

## 2020-09-10 ENCOUNTER — Telehealth: Payer: Self-pay | Admitting: Podiatry

## 2020-09-10 ENCOUNTER — Other Ambulatory Visit: Payer: Medicare PPO | Admitting: Orthotics

## 2020-09-10 NOTE — Telephone Encounter (Signed)
Pt called to cancel appt that was scheduled for 12.16.2021 @ 930 for diabetic shoe measurements. He said he was going elsewhere.

## 2020-09-24 DIAGNOSIS — K5730 Diverticulosis of large intestine without perforation or abscess without bleeding: Secondary | ICD-10-CM | POA: Diagnosis not present

## 2020-09-24 DIAGNOSIS — Z981 Arthrodesis status: Secondary | ICD-10-CM | POA: Diagnosis not present

## 2020-09-24 DIAGNOSIS — I700 Atherosclerosis of aorta: Secondary | ICD-10-CM | POA: Diagnosis not present

## 2020-09-24 DIAGNOSIS — N2889 Other specified disorders of kidney and ureter: Secondary | ICD-10-CM | POA: Diagnosis not present

## 2020-09-24 DIAGNOSIS — Z905 Acquired absence of kidney: Secondary | ICD-10-CM | POA: Diagnosis not present

## 2020-09-30 DIAGNOSIS — E1129 Type 2 diabetes mellitus with other diabetic kidney complication: Secondary | ICD-10-CM | POA: Diagnosis not present

## 2020-09-30 DIAGNOSIS — E782 Mixed hyperlipidemia: Secondary | ICD-10-CM | POA: Diagnosis not present

## 2020-09-30 DIAGNOSIS — C61 Malignant neoplasm of prostate: Secondary | ICD-10-CM | POA: Diagnosis not present

## 2020-09-30 DIAGNOSIS — Z79899 Other long term (current) drug therapy: Secondary | ICD-10-CM | POA: Diagnosis not present

## 2020-10-01 ENCOUNTER — Other Ambulatory Visit: Payer: Self-pay

## 2020-10-01 ENCOUNTER — Ambulatory Visit: Payer: Medicare PPO | Admitting: Podiatry

## 2020-10-01 ENCOUNTER — Encounter: Payer: Self-pay | Admitting: Podiatry

## 2020-10-01 DIAGNOSIS — N281 Cyst of kidney, acquired: Secondary | ICD-10-CM | POA: Diagnosis not present

## 2020-10-01 DIAGNOSIS — B351 Tinea unguium: Secondary | ICD-10-CM

## 2020-10-01 DIAGNOSIS — L84 Corns and callosities: Secondary | ICD-10-CM

## 2020-10-01 DIAGNOSIS — G5761 Lesion of plantar nerve, right lower limb: Secondary | ICD-10-CM | POA: Diagnosis not present

## 2020-10-01 DIAGNOSIS — E1140 Type 2 diabetes mellitus with diabetic neuropathy, unspecified: Secondary | ICD-10-CM

## 2020-10-01 DIAGNOSIS — N2889 Other specified disorders of kidney and ureter: Secondary | ICD-10-CM | POA: Diagnosis not present

## 2020-10-01 DIAGNOSIS — K7689 Other specified diseases of liver: Secondary | ICD-10-CM | POA: Diagnosis not present

## 2020-10-01 DIAGNOSIS — Z981 Arthrodesis status: Secondary | ICD-10-CM | POA: Diagnosis not present

## 2020-10-01 DIAGNOSIS — N200 Calculus of kidney: Secondary | ICD-10-CM | POA: Diagnosis not present

## 2020-10-01 MED ORDER — BETAMETHASONE SOD PHOS & ACET 6 (3-3) MG/ML IJ SUSP
3.0000 mg | Freq: Once | INTRAMUSCULAR | Status: AC
  Administered 2020-10-01: 3 mg

## 2020-10-01 NOTE — Progress Notes (Signed)
  Subjective:  Patient ID: Alexander King, male    DOB: [birth year],  MRN: [id number]  Chief Complaint  Patient presents with  . Nail Problem    Trim nails    75 y.o. male presents with the above complaint. History confirmed with patient.   Objective:  Physical Exam: warm, good capillary refill, nail exam onychomycosis of the toenails, no trophic changes or ulcerative lesions. DP pulses palpable, PT pulses palpable and protective sensation intact Left Foot: normal exam, no swelling, tenderness, instability; ligaments intact, full range of motion of all ankle/foot joints  Right Foot: normal exam, no swelling, tenderness, instability; ligaments intact, full range of motion of all ankle/foot joints   No images are attached to the encounter.  Assessment:   1. Type 2 diabetes mellitus with diabetic neuropathy, without long-term current use of insulin ([facility])   2. Callus   3. Onychomycosis   4. Morton neuroma, right    Plan:  Patient was evaluated and treated and all questions answered.  Onychomycosis, Diabetes and DPN -Patient is diabetic with a qualifying condition for at risk foot care.  Procedure: Nail Debridement Type of Debridement: manual, sharp debridement. Instrumentation: Nail nipper, rotary burr. Number of Nails: 10  Neuroma Right -Injection delivered as below  Procedure: Neuroma Injection Location: Right 3rd interspace Skin Prep: Alcohol. Injectate: 0.5 cc 0.5% marcaine plain, 0.5 cc dexamethasone phosphate. Disposition: Patient tolerated procedure well. Injection site dressed with a band-aid.  No follow-ups on file.

## 2020-10-02 NOTE — Addendum Note (Signed)
Addended by: Cranford Mon R on: 10/02/2020 08:04 AM   Modules accepted: Orders

## 2020-10-05 ENCOUNTER — Telehealth: Payer: Self-pay | Admitting: Podiatry

## 2020-10-05 DIAGNOSIS — Z0000 Encounter for general adult medical examination without abnormal findings: Secondary | ICD-10-CM | POA: Diagnosis not present

## 2020-10-05 DIAGNOSIS — Z6841 Body Mass Index (BMI) 40.0 and over, adult: Secondary | ICD-10-CM | POA: Diagnosis not present

## 2020-10-05 DIAGNOSIS — Z1331 Encounter for screening for depression: Secondary | ICD-10-CM | POA: Diagnosis not present

## 2020-10-05 DIAGNOSIS — I10 Essential (primary) hypertension: Secondary | ICD-10-CM | POA: Diagnosis not present

## 2020-10-05 DIAGNOSIS — Z9181 History of falling: Secondary | ICD-10-CM | POA: Diagnosis not present

## 2020-10-05 DIAGNOSIS — N183 Chronic kidney disease, stage 3 unspecified: Secondary | ICD-10-CM | POA: Diagnosis not present

## 2020-10-05 DIAGNOSIS — E1122 Type 2 diabetes mellitus with diabetic chronic kidney disease: Secondary | ICD-10-CM | POA: Diagnosis not present

## 2020-10-05 DIAGNOSIS — N390 Urinary tract infection, site not specified: Secondary | ICD-10-CM | POA: Diagnosis not present

## 2020-10-05 NOTE — Telephone Encounter (Signed)
Pt called and stated he was trying to go somewhere else to get diabetic shoes but has been unsuccessful so he wanted to come back to [place]. I scheduled him to see Liliane Channel 1.12.2022

## 2020-10-06 NOTE — Telephone Encounter (Signed)
Noted thanks °

## 2020-10-06 NOTE — Telephone Encounter (Signed)
Our office will be glad to assist him. Thanks.

## 2020-10-07 ENCOUNTER — Ambulatory Visit: Payer: Medicare PPO | Admitting: Orthotics

## 2020-10-07 ENCOUNTER — Other Ambulatory Visit: Payer: Self-pay

## 2020-10-07 DIAGNOSIS — M2012 Hallux valgus (acquired), left foot: Secondary | ICD-10-CM

## 2020-10-07 DIAGNOSIS — G5761 Lesion of plantar nerve, right lower limb: Secondary | ICD-10-CM

## 2020-10-07 DIAGNOSIS — L84 Corns and callosities: Secondary | ICD-10-CM

## 2020-10-07 DIAGNOSIS — M2011 Hallux valgus (acquired), right foot: Secondary | ICD-10-CM

## 2020-10-07 DIAGNOSIS — E1140 Type 2 diabetes mellitus with diabetic neuropathy, unspecified: Secondary | ICD-10-CM

## 2020-10-07 DIAGNOSIS — M2041 Other hammer toe(s) (acquired), right foot: Secondary | ICD-10-CM

## 2020-10-07 NOTE — Progress Notes (Signed)

## 2020-10-08 ENCOUNTER — Ambulatory Visit: Payer: Medicare PPO | Admitting: Podiatry

## 2020-10-15 ENCOUNTER — Ambulatory Visit: Payer: Medicare PPO | Admitting: Podiatry

## 2020-10-15 DIAGNOSIS — J40 Bronchitis, not specified as acute or chronic: Secondary | ICD-10-CM | POA: Diagnosis not present

## 2020-10-15 DIAGNOSIS — Z20822 Contact with and (suspected) exposure to covid-19: Secondary | ICD-10-CM | POA: Diagnosis not present

## 2020-10-15 DIAGNOSIS — J329 Chronic sinusitis, unspecified: Secondary | ICD-10-CM | POA: Diagnosis not present

## 2020-11-02 ENCOUNTER — Ambulatory Visit: Payer: Medicare PPO | Admitting: Podiatry

## 2020-11-05 ENCOUNTER — Ambulatory Visit: Payer: Medicare PPO | Admitting: Podiatry

## 2020-11-05 ENCOUNTER — Encounter: Payer: Self-pay | Admitting: Podiatry

## 2020-11-05 ENCOUNTER — Other Ambulatory Visit: Payer: Self-pay

## 2020-11-05 DIAGNOSIS — G5763 Lesion of plantar nerve, bilateral lower limbs: Secondary | ICD-10-CM | POA: Diagnosis not present

## 2020-11-05 MED ORDER — BETAMETHASONE SOD PHOS & ACET 6 (3-3) MG/ML IJ SUSP
6.0000 mg | Freq: Once | INTRAMUSCULAR | Status: AC
  Administered 2020-11-05: 6 mg

## 2020-11-05 NOTE — Progress Notes (Signed)
  Subjective:  Patient ID: Alexander King, male    DOB: 1946-03-18,  MRN: [id number]  Chief Complaint  Patient presents with  . Foot Problem    The right foot is better and the shot helped    75 y.o. male presents with the above complaint. History confirmed with patient. States that he now has the symptoms in both feet. Otherwise doing well without new complaints.  Objective:  Physical Exam: warm, good capillary refill, no trophic changes or ulcerative lesions, normal DP and PT pulses and normal sensory exam. POP 3rd interspace bilat with mulder's click. Assessment:   1. Morton's neuroma of both feet    Plan:  Patient was evaluated and treated and all questions answered.  Morton Neuroma -Injection delivered to the affected interspaces  Procedure: Neuroma Injection Location: Bilateral 3rd interspace Skin Prep: Alcohol. Injectate: 0.5 cc 0.5% marcaine plain, 0.5 cc betamethasone acetate-betamethasone sodium phosphate Disposition: Patient tolerated procedure well. Injection site dressed with a band-aid.  No follow-ups on file.

## 2020-11-09 ENCOUNTER — Ambulatory Visit: Payer: Medicare PPO | Admitting: Podiatry

## 2020-11-09 DIAGNOSIS — N390 Urinary tract infection, site not specified: Secondary | ICD-10-CM | POA: Diagnosis not present

## 2020-11-16 DIAGNOSIS — R339 Retention of urine, unspecified: Secondary | ICD-10-CM | POA: Diagnosis not present

## 2020-11-16 DIAGNOSIS — Z87440 Personal history of urinary (tract) infections: Secondary | ICD-10-CM | POA: Diagnosis not present

## 2020-11-16 DIAGNOSIS — N319 Neuromuscular dysfunction of bladder, unspecified: Secondary | ICD-10-CM | POA: Diagnosis not present

## 2020-11-16 DIAGNOSIS — C61 Malignant neoplasm of prostate: Secondary | ICD-10-CM | POA: Diagnosis not present

## 2020-11-16 DIAGNOSIS — N318 Other neuromuscular dysfunction of bladder: Secondary | ICD-10-CM | POA: Diagnosis not present

## 2020-11-30 ENCOUNTER — Other Ambulatory Visit: Payer: Self-pay

## 2020-11-30 ENCOUNTER — Ambulatory Visit: Payer: Medicare PPO | Admitting: Podiatry

## 2020-11-30 DIAGNOSIS — G5763 Lesion of plantar nerve, bilateral lower limbs: Secondary | ICD-10-CM

## 2020-11-30 DIAGNOSIS — B351 Tinea unguium: Secondary | ICD-10-CM | POA: Diagnosis not present

## 2020-11-30 DIAGNOSIS — E1142 Type 2 diabetes mellitus with diabetic polyneuropathy: Secondary | ICD-10-CM | POA: Diagnosis not present

## 2020-11-30 DIAGNOSIS — E1169 Type 2 diabetes mellitus with other specified complication: Secondary | ICD-10-CM | POA: Diagnosis not present

## 2020-11-30 MED ORDER — BETAMETHASONE SOD PHOS & ACET 6 (3-3) MG/ML IJ SUSP
6.0000 mg | Freq: Once | INTRAMUSCULAR | Status: AC
  Administered 2020-11-30: 6 mg

## 2020-11-30 NOTE — Progress Notes (Signed)
  Subjective:  Patient ID: Alexander King, male    DOB: January 19, 1946,  MRN: [id number]  Chief Complaint  Patient presents with  . Neuroma    F/U BL neuroma -pt states," I think they're better. ' -little pain or mushy feelin tx: gaba    75 y.o. male presents with the above complaint. History confirmed with patient. Also requesting care of his nails today.  Objective:  Physical Exam: warm, good capillary refill, no trophic changes or ulcerative lesions, normal DP and PT pulses and absent protective sensation. Nails thickened and dystrophic. POP 3rd interspace bilat with mulder's click. Assessment:   1. Morton's neuroma of both feet   2. 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  Morton Neuroma -Repeat injection -Injection delivered to the affected interspaces -Consider sclerosing therapy next visit.  Procedure: Neuroma Injection Location: Bilateral 3rd interspace Skin Prep: Alcohol. Injectate: 0.5 cc 0.5% marcaine plain, 0.5 cc betamethasone acetate-betamethasone sodium phosphate Disposition: Patient tolerated procedure well. Injection site dressed with a band-aid.    Onychomycosis, Diabetes and DPN -Patient is diabetic with a qualifying condition for at risk foot care.  Procedure: Nail Debridement Type of Debridement: manual, sharp debridement. Instrumentation: Nail nipper, rotary burr. Number of Nails: 10     Return in about 1 month (around 12/31/2020) for Neuroma.

## 2020-12-04 DIAGNOSIS — N390 Urinary tract infection, site not specified: Secondary | ICD-10-CM | POA: Diagnosis not present

## 2020-12-08 DIAGNOSIS — C61 Malignant neoplasm of prostate: Secondary | ICD-10-CM | POA: Diagnosis not present

## 2020-12-08 DIAGNOSIS — Z87440 Personal history of urinary (tract) infections: Secondary | ICD-10-CM | POA: Diagnosis not present

## 2020-12-08 DIAGNOSIS — N401 Enlarged prostate with lower urinary tract symptoms: Secondary | ICD-10-CM | POA: Diagnosis not present

## 2020-12-08 DIAGNOSIS — R8290 Unspecified abnormal findings in urine: Secondary | ICD-10-CM | POA: Diagnosis not present

## 2020-12-08 DIAGNOSIS — N138 Other obstructive and reflux uropathy: Secondary | ICD-10-CM | POA: Diagnosis not present

## 2020-12-08 DIAGNOSIS — N319 Neuromuscular dysfunction of bladder, unspecified: Secondary | ICD-10-CM | POA: Diagnosis not present

## 2020-12-28 ENCOUNTER — Ambulatory Visit (INDEPENDENT_AMBULATORY_CARE_PROVIDER_SITE_OTHER): Payer: Medicare PPO | Admitting: Podiatry

## 2020-12-28 ENCOUNTER — Encounter: Payer: Self-pay | Admitting: Podiatry

## 2020-12-28 ENCOUNTER — Other Ambulatory Visit: Payer: Self-pay

## 2020-12-28 DIAGNOSIS — G5763 Lesion of plantar nerve, bilateral lower limbs: Secondary | ICD-10-CM | POA: Diagnosis not present

## 2020-12-28 MED ORDER — TRIAMCINOLONE ACETONIDE 40 MG/ML IJ SUSP
40.0000 mg | Freq: Once | INTRAMUSCULAR | Status: AC
  Administered 2020-12-28: 40 mg via INTRAMUSCULAR

## 2020-12-28 NOTE — Progress Notes (Signed)
  Subjective:  Patient ID: Alexander King, male    DOB: Jun 16, 1946,  MRN: [id number]  Chief Complaint  Patient presents with  . Nail Problem    I need to get the toenails rounded off some   . Neuroma    I really think I need something stronger this time    75 y.o. male presents with the above complaint. History confirmed with patient.  Objective:  Physical Exam: warm, good capillary refill, no trophic changes or ulcerative lesions, normal DP and PT pulses and absent protective sensation. Nails thickened and dystrophic. POP 3rd interspace bilat with mulder's click. Assessment:   1. Morton's neuroma of both feet    Plan:  Patient was evaluated and treated and all questions answered.  Morton Neuroma -Cannot proceed with sclerosing injection as medication unavailable. Repeat steroid injection.  Procedure: Neuroma Injection Location: Bilateral 3rd interspace Skin Prep: Alcohol. Injectate: 0.5 cc 0.5% marcaine plain, 0.5 cc betamethasone acetate-betamethasone sodium phosphate Disposition: Patient tolerated procedure well. Injection site dressed with a band-aid.   Onychomycosis with ingrowing nails -Nails debrided x2 in slant back fashion to patient relief.  No follow-ups on file.

## 2020-12-29 DIAGNOSIS — M4316 Spondylolisthesis, lumbar region: Secondary | ICD-10-CM | POA: Diagnosis not present

## 2020-12-29 DIAGNOSIS — R339 Retention of urine, unspecified: Secondary | ICD-10-CM | POA: Diagnosis not present

## 2020-12-31 ENCOUNTER — Ambulatory Visit: Payer: Medicare PPO | Admitting: Podiatry

## 2021-01-03 DIAGNOSIS — R309 Painful micturition, unspecified: Secondary | ICD-10-CM | POA: Diagnosis not present

## 2021-01-03 DIAGNOSIS — R21 Rash and other nonspecific skin eruption: Secondary | ICD-10-CM | POA: Diagnosis not present

## 2021-01-03 DIAGNOSIS — R300 Dysuria: Secondary | ICD-10-CM | POA: Diagnosis not present

## 2021-01-03 DIAGNOSIS — M545 Low back pain, unspecified: Secondary | ICD-10-CM | POA: Diagnosis not present

## 2021-01-04 ENCOUNTER — Ambulatory Visit: Payer: Medicare PPO | Admitting: Podiatry

## 2021-01-04 DIAGNOSIS — G4733 Obstructive sleep apnea (adult) (pediatric): Secondary | ICD-10-CM | POA: Diagnosis not present

## 2021-01-12 DIAGNOSIS — B952 Enterococcus as the cause of diseases classified elsewhere: Secondary | ICD-10-CM | POA: Diagnosis not present

## 2021-01-12 DIAGNOSIS — R509 Fever, unspecified: Secondary | ICD-10-CM | POA: Diagnosis not present

## 2021-01-12 DIAGNOSIS — N390 Urinary tract infection, site not specified: Secondary | ICD-10-CM | POA: Diagnosis not present

## 2021-01-13 DIAGNOSIS — C61 Malignant neoplasm of prostate: Secondary | ICD-10-CM | POA: Diagnosis not present

## 2021-01-13 DIAGNOSIS — N319 Neuromuscular dysfunction of bladder, unspecified: Secondary | ICD-10-CM | POA: Diagnosis not present

## 2021-01-13 DIAGNOSIS — N138 Other obstructive and reflux uropathy: Secondary | ICD-10-CM | POA: Diagnosis not present

## 2021-01-13 DIAGNOSIS — Z87440 Personal history of urinary (tract) infections: Secondary | ICD-10-CM | POA: Diagnosis not present

## 2021-01-13 DIAGNOSIS — R3912 Poor urinary stream: Secondary | ICD-10-CM | POA: Diagnosis not present

## 2021-01-13 DIAGNOSIS — R351 Nocturia: Secondary | ICD-10-CM | POA: Diagnosis not present

## 2021-01-13 DIAGNOSIS — N401 Enlarged prostate with lower urinary tract symptoms: Secondary | ICD-10-CM | POA: Diagnosis not present

## 2021-01-13 DIAGNOSIS — R350 Frequency of micturition: Secondary | ICD-10-CM | POA: Diagnosis not present

## 2021-01-13 DIAGNOSIS — N3941 Urge incontinence: Secondary | ICD-10-CM | POA: Diagnosis not present

## 2021-01-14 DIAGNOSIS — R339 Retention of urine, unspecified: Secondary | ICD-10-CM | POA: Diagnosis not present

## 2021-01-15 DIAGNOSIS — C61 Malignant neoplasm of prostate: Secondary | ICD-10-CM | POA: Diagnosis not present

## 2021-01-15 DIAGNOSIS — E1129 Type 2 diabetes mellitus with other diabetic kidney complication: Secondary | ICD-10-CM | POA: Diagnosis not present

## 2021-01-27 DIAGNOSIS — D72829 Elevated white blood cell count, unspecified: Secondary | ICD-10-CM | POA: Diagnosis not present

## 2021-01-27 DIAGNOSIS — R8290 Unspecified abnormal findings in urine: Secondary | ICD-10-CM | POA: Diagnosis not present

## 2021-01-27 DIAGNOSIS — I252 Old myocardial infarction: Secondary | ICD-10-CM | POA: Diagnosis not present

## 2021-01-27 DIAGNOSIS — R82998 Other abnormal findings in urine: Secondary | ICD-10-CM | POA: Diagnosis not present

## 2021-01-27 DIAGNOSIS — Z01810 Encounter for preprocedural cardiovascular examination: Secondary | ICD-10-CM | POA: Diagnosis not present

## 2021-01-27 DIAGNOSIS — Z01812 Encounter for preprocedural laboratory examination: Secondary | ICD-10-CM | POA: Diagnosis not present

## 2021-01-27 DIAGNOSIS — N401 Enlarged prostate with lower urinary tract symptoms: Secondary | ICD-10-CM | POA: Diagnosis not present

## 2021-01-27 DIAGNOSIS — Z87440 Personal history of urinary (tract) infections: Secondary | ICD-10-CM | POA: Diagnosis not present

## 2021-01-27 DIAGNOSIS — N319 Neuromuscular dysfunction of bladder, unspecified: Secondary | ICD-10-CM | POA: Diagnosis not present

## 2021-01-27 DIAGNOSIS — R3129 Other microscopic hematuria: Secondary | ICD-10-CM | POA: Diagnosis not present

## 2021-01-27 DIAGNOSIS — R808 Other proteinuria: Secondary | ICD-10-CM | POA: Diagnosis not present

## 2021-01-27 DIAGNOSIS — R001 Bradycardia, unspecified: Secondary | ICD-10-CM | POA: Diagnosis not present

## 2021-01-27 DIAGNOSIS — N138 Other obstructive and reflux uropathy: Secondary | ICD-10-CM | POA: Diagnosis not present

## 2021-01-27 DIAGNOSIS — C61 Malignant neoplasm of prostate: Secondary | ICD-10-CM | POA: Diagnosis not present

## 2021-01-28 ENCOUNTER — Ambulatory Visit (INDEPENDENT_AMBULATORY_CARE_PROVIDER_SITE_OTHER): Payer: Medicare PPO | Admitting: Podiatry

## 2021-01-28 DIAGNOSIS — Z01812 Encounter for preprocedural laboratory examination: Secondary | ICD-10-CM | POA: Diagnosis not present

## 2021-01-28 DIAGNOSIS — I252 Old myocardial infarction: Secondary | ICD-10-CM | POA: Diagnosis not present

## 2021-01-28 DIAGNOSIS — N138 Other obstructive and reflux uropathy: Secondary | ICD-10-CM | POA: Diagnosis not present

## 2021-01-28 DIAGNOSIS — R001 Bradycardia, unspecified: Secondary | ICD-10-CM | POA: Diagnosis not present

## 2021-01-28 DIAGNOSIS — R8290 Unspecified abnormal findings in urine: Secondary | ICD-10-CM | POA: Diagnosis not present

## 2021-01-28 DIAGNOSIS — N319 Neuromuscular dysfunction of bladder, unspecified: Secondary | ICD-10-CM | POA: Diagnosis not present

## 2021-01-28 DIAGNOSIS — N401 Enlarged prostate with lower urinary tract symptoms: Secondary | ICD-10-CM | POA: Diagnosis not present

## 2021-01-28 DIAGNOSIS — Z5329 Procedure and treatment not carried out because of patient's decision for other reasons: Secondary | ICD-10-CM

## 2021-01-28 DIAGNOSIS — C61 Malignant neoplasm of prostate: Secondary | ICD-10-CM | POA: Diagnosis not present

## 2021-01-28 DIAGNOSIS — Z01810 Encounter for preprocedural cardiovascular examination: Secondary | ICD-10-CM | POA: Diagnosis not present

## 2021-01-28 NOTE — Progress Notes (Signed)
No show for appt. 

## 2021-01-29 DIAGNOSIS — I252 Old myocardial infarction: Secondary | ICD-10-CM | POA: Diagnosis not present

## 2021-01-29 DIAGNOSIS — R001 Bradycardia, unspecified: Secondary | ICD-10-CM | POA: Diagnosis not present

## 2021-02-01 DIAGNOSIS — N138 Other obstructive and reflux uropathy: Secondary | ICD-10-CM | POA: Diagnosis not present

## 2021-02-01 DIAGNOSIS — G4730 Sleep apnea, unspecified: Secondary | ICD-10-CM | POA: Diagnosis not present

## 2021-02-01 DIAGNOSIS — Z794 Long term (current) use of insulin: Secondary | ICD-10-CM | POA: Diagnosis not present

## 2021-02-01 DIAGNOSIS — N400 Enlarged prostate without lower urinary tract symptoms: Secondary | ICD-10-CM | POA: Diagnosis not present

## 2021-02-01 DIAGNOSIS — C61 Malignant neoplasm of prostate: Secondary | ICD-10-CM | POA: Diagnosis not present

## 2021-02-01 DIAGNOSIS — N319 Neuromuscular dysfunction of bladder, unspecified: Secondary | ICD-10-CM | POA: Diagnosis not present

## 2021-02-01 DIAGNOSIS — Z87440 Personal history of urinary (tract) infections: Secondary | ICD-10-CM | POA: Diagnosis not present

## 2021-02-01 DIAGNOSIS — E669 Obesity, unspecified: Secondary | ICD-10-CM | POA: Diagnosis not present

## 2021-02-01 DIAGNOSIS — E119 Type 2 diabetes mellitus without complications: Secondary | ICD-10-CM | POA: Diagnosis not present

## 2021-02-01 DIAGNOSIS — N401 Enlarged prostate with lower urinary tract symptoms: Secondary | ICD-10-CM | POA: Diagnosis not present

## 2021-02-01 DIAGNOSIS — Z6841 Body Mass Index (BMI) 40.0 and over, adult: Secondary | ICD-10-CM | POA: Diagnosis not present

## 2021-02-02 DIAGNOSIS — E119 Type 2 diabetes mellitus without complications: Secondary | ICD-10-CM | POA: Diagnosis not present

## 2021-02-02 DIAGNOSIS — E669 Obesity, unspecified: Secondary | ICD-10-CM | POA: Diagnosis not present

## 2021-02-02 DIAGNOSIS — G4730 Sleep apnea, unspecified: Secondary | ICD-10-CM | POA: Diagnosis not present

## 2021-02-02 DIAGNOSIS — Z6841 Body Mass Index (BMI) 40.0 and over, adult: Secondary | ICD-10-CM | POA: Diagnosis not present

## 2021-02-02 DIAGNOSIS — N401 Enlarged prostate with lower urinary tract symptoms: Secondary | ICD-10-CM | POA: Diagnosis not present

## 2021-02-02 DIAGNOSIS — N319 Neuromuscular dysfunction of bladder, unspecified: Secondary | ICD-10-CM | POA: Diagnosis not present

## 2021-02-02 DIAGNOSIS — N138 Other obstructive and reflux uropathy: Secondary | ICD-10-CM | POA: Diagnosis not present

## 2021-02-02 DIAGNOSIS — Z87440 Personal history of urinary (tract) infections: Secondary | ICD-10-CM | POA: Diagnosis not present

## 2021-02-02 DIAGNOSIS — Z794 Long term (current) use of insulin: Secondary | ICD-10-CM | POA: Diagnosis not present

## 2021-02-05 ENCOUNTER — Telehealth: Payer: Self-pay | Admitting: Podiatry

## 2021-02-10 NOTE — Telephone Encounter (Signed)
Pt scheduled 5.19 in [place] to pick up his diabetic shoes.

## 2021-02-11 ENCOUNTER — Ambulatory Visit (INDEPENDENT_AMBULATORY_CARE_PROVIDER_SITE_OTHER): Payer: Medicare PPO | Admitting: Podiatry

## 2021-02-11 ENCOUNTER — Other Ambulatory Visit: Payer: Self-pay

## 2021-02-11 DIAGNOSIS — M2012 Hallux valgus (acquired), left foot: Secondary | ICD-10-CM

## 2021-02-11 DIAGNOSIS — M2011 Hallux valgus (acquired), right foot: Secondary | ICD-10-CM

## 2021-02-11 DIAGNOSIS — G5761 Lesion of plantar nerve, right lower limb: Secondary | ICD-10-CM

## 2021-02-11 DIAGNOSIS — E1140 Type 2 diabetes mellitus with diabetic neuropathy, unspecified: Secondary | ICD-10-CM

## 2021-02-11 NOTE — Progress Notes (Signed)
The patient presented to the office to day to pick up diabetic shoes and 3 pair diabetic custom inserts.  1 pair of inserts were put in the shoes and the shoes were fitted to the patient. The patient states they are comfortable and free of defect. He was satisfied with the fit of the shoe. Instructions for break in and wear were dispensed. The patient signed the delivery documentation and break in instruction form.  If any concerns or questions arise, he is instructed to call 

## 2021-02-12 DIAGNOSIS — E119 Type 2 diabetes mellitus without complications: Secondary | ICD-10-CM | POA: Diagnosis not present

## 2021-02-12 DIAGNOSIS — H35373 Puckering of macula, bilateral: Secondary | ICD-10-CM | POA: Diagnosis not present

## 2021-02-17 DIAGNOSIS — N401 Enlarged prostate with lower urinary tract symptoms: Secondary | ICD-10-CM | POA: Diagnosis not present

## 2021-02-17 DIAGNOSIS — C61 Malignant neoplasm of prostate: Secondary | ICD-10-CM | POA: Diagnosis not present

## 2021-02-17 DIAGNOSIS — R8290 Unspecified abnormal findings in urine: Secondary | ICD-10-CM | POA: Diagnosis not present

## 2021-02-17 DIAGNOSIS — N451 Epididymitis: Secondary | ICD-10-CM | POA: Diagnosis not present

## 2021-02-17 DIAGNOSIS — N138 Other obstructive and reflux uropathy: Secondary | ICD-10-CM | POA: Diagnosis not present

## 2021-02-17 DIAGNOSIS — B9562 Methicillin resistant Staphylococcus aureus infection as the cause of diseases classified elsewhere: Secondary | ICD-10-CM | POA: Diagnosis not present

## 2021-02-17 DIAGNOSIS — Z87440 Personal history of urinary (tract) infections: Secondary | ICD-10-CM | POA: Diagnosis not present

## 2021-02-17 DIAGNOSIS — N390 Urinary tract infection, site not specified: Secondary | ICD-10-CM | POA: Diagnosis not present

## 2021-02-24 DIAGNOSIS — R3912 Poor urinary stream: Secondary | ICD-10-CM | POA: Diagnosis not present

## 2021-02-24 DIAGNOSIS — N138 Other obstructive and reflux uropathy: Secondary | ICD-10-CM | POA: Diagnosis not present

## 2021-02-24 DIAGNOSIS — N401 Enlarged prostate with lower urinary tract symptoms: Secondary | ICD-10-CM | POA: Diagnosis not present

## 2021-03-01 DIAGNOSIS — R339 Retention of urine, unspecified: Secondary | ICD-10-CM | POA: Diagnosis not present

## 2021-03-04 DIAGNOSIS — N3941 Urge incontinence: Secondary | ICD-10-CM | POA: Diagnosis not present

## 2021-03-04 DIAGNOSIS — R509 Fever, unspecified: Secondary | ICD-10-CM | POA: Diagnosis not present

## 2021-03-04 DIAGNOSIS — R062 Wheezing: Secondary | ICD-10-CM | POA: Diagnosis not present

## 2021-03-04 DIAGNOSIS — N390 Urinary tract infection, site not specified: Secondary | ICD-10-CM | POA: Diagnosis not present

## 2021-03-04 DIAGNOSIS — Z6839 Body mass index (BMI) 39.0-39.9, adult: Secondary | ICD-10-CM | POA: Diagnosis not present

## 2021-03-04 DIAGNOSIS — N401 Enlarged prostate with lower urinary tract symptoms: Secondary | ICD-10-CM | POA: Diagnosis not present

## 2021-03-04 DIAGNOSIS — R051 Acute cough: Secondary | ICD-10-CM | POA: Diagnosis not present

## 2021-03-04 DIAGNOSIS — N138 Other obstructive and reflux uropathy: Secondary | ICD-10-CM | POA: Diagnosis not present

## 2021-03-13 DIAGNOSIS — R531 Weakness: Secondary | ICD-10-CM | POA: Diagnosis not present

## 2021-03-13 DIAGNOSIS — M1990 Unspecified osteoarthritis, unspecified site: Secondary | ICD-10-CM | POA: Diagnosis not present

## 2021-03-13 DIAGNOSIS — Z9889 Other specified postprocedural states: Secondary | ICD-10-CM | POA: Diagnosis not present

## 2021-03-13 DIAGNOSIS — R6521 Severe sepsis with septic shock: Secondary | ICD-10-CM | POA: Diagnosis not present

## 2021-03-13 DIAGNOSIS — N390 Urinary tract infection, site not specified: Secondary | ICD-10-CM | POA: Diagnosis not present

## 2021-03-13 DIAGNOSIS — R509 Fever, unspecified: Secondary | ICD-10-CM | POA: Diagnosis not present

## 2021-03-13 DIAGNOSIS — R42 Dizziness and giddiness: Secondary | ICD-10-CM | POA: Diagnosis not present

## 2021-03-13 DIAGNOSIS — R7989 Other specified abnormal findings of blood chemistry: Secondary | ICD-10-CM | POA: Diagnosis not present

## 2021-03-13 DIAGNOSIS — N281 Cyst of kidney, acquired: Secondary | ICD-10-CM | POA: Diagnosis not present

## 2021-03-13 DIAGNOSIS — A419 Sepsis, unspecified organism: Secondary | ICD-10-CM | POA: Diagnosis not present

## 2021-03-13 DIAGNOSIS — Z7982 Long term (current) use of aspirin: Secondary | ICD-10-CM | POA: Diagnosis not present

## 2021-03-13 DIAGNOSIS — I129 Hypertensive chronic kidney disease with stage 1 through stage 4 chronic kidney disease, or unspecified chronic kidney disease: Secondary | ICD-10-CM | POA: Diagnosis not present

## 2021-03-13 DIAGNOSIS — Z87440 Personal history of urinary (tract) infections: Secondary | ICD-10-CM | POA: Diagnosis not present

## 2021-03-13 DIAGNOSIS — Z794 Long term (current) use of insulin: Secondary | ICD-10-CM | POA: Diagnosis not present

## 2021-03-13 DIAGNOSIS — Z79899 Other long term (current) drug therapy: Secondary | ICD-10-CM | POA: Diagnosis not present

## 2021-03-13 DIAGNOSIS — I959 Hypotension, unspecified: Secondary | ICD-10-CM | POA: Diagnosis not present

## 2021-03-13 DIAGNOSIS — R0902 Hypoxemia: Secondary | ICD-10-CM | POA: Diagnosis not present

## 2021-03-13 DIAGNOSIS — N183 Chronic kidney disease, stage 3 unspecified: Secondary | ICD-10-CM | POA: Diagnosis not present

## 2021-03-17 DIAGNOSIS — N401 Enlarged prostate with lower urinary tract symptoms: Secondary | ICD-10-CM | POA: Diagnosis not present

## 2021-03-17 DIAGNOSIS — N138 Other obstructive and reflux uropathy: Secondary | ICD-10-CM | POA: Diagnosis not present

## 2021-03-18 DIAGNOSIS — N390 Urinary tract infection, site not specified: Secondary | ICD-10-CM | POA: Diagnosis not present

## 2021-03-18 DIAGNOSIS — Z6839 Body mass index (BMI) 39.0-39.9, adult: Secondary | ICD-10-CM | POA: Diagnosis not present

## 2021-03-18 DIAGNOSIS — N312 Flaccid neuropathic bladder, not elsewhere classified: Secondary | ICD-10-CM | POA: Diagnosis not present

## 2021-03-18 DIAGNOSIS — C61 Malignant neoplasm of prostate: Secondary | ICD-10-CM | POA: Diagnosis not present

## 2021-03-28 DIAGNOSIS — N3001 Acute cystitis with hematuria: Secondary | ICD-10-CM | POA: Diagnosis not present

## 2021-03-28 DIAGNOSIS — N3091 Cystitis, unspecified with hematuria: Secondary | ICD-10-CM | POA: Diagnosis not present

## 2021-03-31 DIAGNOSIS — N390 Urinary tract infection, site not specified: Secondary | ICD-10-CM | POA: Diagnosis not present

## 2021-04-01 DIAGNOSIS — C61 Malignant neoplasm of prostate: Secondary | ICD-10-CM | POA: Diagnosis not present

## 2021-04-01 DIAGNOSIS — R3915 Urgency of urination: Secondary | ICD-10-CM | POA: Diagnosis not present

## 2021-04-01 DIAGNOSIS — N401 Enlarged prostate with lower urinary tract symptoms: Secondary | ICD-10-CM | POA: Diagnosis not present

## 2021-04-01 DIAGNOSIS — R350 Frequency of micturition: Secondary | ICD-10-CM | POA: Diagnosis not present

## 2021-04-01 DIAGNOSIS — R351 Nocturia: Secondary | ICD-10-CM | POA: Diagnosis not present

## 2021-04-01 DIAGNOSIS — R3912 Poor urinary stream: Secondary | ICD-10-CM | POA: Diagnosis not present

## 2021-04-07 DIAGNOSIS — N390 Urinary tract infection, site not specified: Secondary | ICD-10-CM | POA: Diagnosis not present

## 2021-04-07 DIAGNOSIS — Z87440 Personal history of urinary (tract) infections: Secondary | ICD-10-CM | POA: Diagnosis not present

## 2021-04-07 DIAGNOSIS — E1122 Type 2 diabetes mellitus with diabetic chronic kidney disease: Secondary | ICD-10-CM | POA: Diagnosis not present

## 2021-04-07 DIAGNOSIS — N401 Enlarged prostate with lower urinary tract symptoms: Secondary | ICD-10-CM | POA: Diagnosis not present

## 2021-04-07 DIAGNOSIS — N138 Other obstructive and reflux uropathy: Secondary | ICD-10-CM | POA: Diagnosis not present

## 2021-04-07 DIAGNOSIS — N183 Chronic kidney disease, stage 3 unspecified: Secondary | ICD-10-CM | POA: Diagnosis not present

## 2021-04-07 DIAGNOSIS — C61 Malignant neoplasm of prostate: Secondary | ICD-10-CM | POA: Diagnosis not present

## 2021-04-07 DIAGNOSIS — Z794 Long term (current) use of insulin: Secondary | ICD-10-CM | POA: Diagnosis not present

## 2021-04-08 ENCOUNTER — Encounter: Payer: Self-pay | Admitting: Podiatry

## 2021-04-08 ENCOUNTER — Other Ambulatory Visit: Payer: Self-pay

## 2021-04-08 ENCOUNTER — Ambulatory Visit: Payer: Medicare PPO | Admitting: Podiatry

## 2021-04-08 DIAGNOSIS — B351 Tinea unguium: Secondary | ICD-10-CM

## 2021-04-08 DIAGNOSIS — E1169 Type 2 diabetes mellitus with other specified complication: Secondary | ICD-10-CM | POA: Diagnosis not present

## 2021-04-08 DIAGNOSIS — E1142 Type 2 diabetes mellitus with diabetic polyneuropathy: Secondary | ICD-10-CM

## 2021-04-09 DIAGNOSIS — N390 Urinary tract infection, site not specified: Secondary | ICD-10-CM | POA: Diagnosis not present

## 2021-04-15 NOTE — Progress Notes (Signed)
  Subjective:  Patient ID: Alexander King, male    DOB: 1946/06/24,  MRN: [id number]  Chief Complaint  Patient presents with   Nail Problem    Trim nails     75 y.o. male presents with the above complaint. History confirmed with patient.  Denies new pedal concerns  Objective:  Physical Exam: warm, good capillary refill, nail exam onychomycosis of the toenails, no trophic changes or ulcerative lesions. DP pulses palpable, PT pulses palpable, and protective sensation absent   No images are attached to the encounter.  Assessment:   1. 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)      Plan:  Patient was evaluated and treated and all questions answered.  Onychomycosis, Diabetes, and DPN -Patient is diabetic with a qualifying condition for at risk foot care.  Procedure: Nail Debridement Type of Debridement: manual, sharp debridement. Instrumentation: Nail nipper, rotary burr. Number of Nails: 10  Return in about 3 months (around 07/09/2021) for Diabetic Foot Care.

## 2021-04-19 DIAGNOSIS — R350 Frequency of micturition: Secondary | ICD-10-CM | POA: Diagnosis not present

## 2021-04-19 DIAGNOSIS — R3915 Urgency of urination: Secondary | ICD-10-CM | POA: Diagnosis not present

## 2021-04-19 DIAGNOSIS — R300 Dysuria: Secondary | ICD-10-CM | POA: Diagnosis not present

## 2021-04-19 DIAGNOSIS — N401 Enlarged prostate with lower urinary tract symptoms: Secondary | ICD-10-CM | POA: Diagnosis not present

## 2021-04-20 DIAGNOSIS — R3915 Urgency of urination: Secondary | ICD-10-CM | POA: Diagnosis not present

## 2021-04-20 DIAGNOSIS — R350 Frequency of micturition: Secondary | ICD-10-CM | POA: Diagnosis not present

## 2021-04-20 DIAGNOSIS — N3941 Urge incontinence: Secondary | ICD-10-CM | POA: Diagnosis not present

## 2021-04-22 DIAGNOSIS — Z6838 Body mass index (BMI) 38.0-38.9, adult: Secondary | ICD-10-CM | POA: Diagnosis not present

## 2021-04-22 DIAGNOSIS — I10 Essential (primary) hypertension: Secondary | ICD-10-CM | POA: Diagnosis not present

## 2021-04-22 DIAGNOSIS — M1612 Unilateral primary osteoarthritis, left hip: Secondary | ICD-10-CM | POA: Diagnosis not present

## 2021-04-22 DIAGNOSIS — M47816 Spondylosis without myelopathy or radiculopathy, lumbar region: Secondary | ICD-10-CM | POA: Diagnosis not present

## 2021-04-23 DIAGNOSIS — N390 Urinary tract infection, site not specified: Secondary | ICD-10-CM | POA: Diagnosis not present

## 2021-05-04 DIAGNOSIS — L853 Xerosis cutis: Secondary | ICD-10-CM | POA: Diagnosis not present

## 2021-05-04 DIAGNOSIS — M47816 Spondylosis without myelopathy or radiculopathy, lumbar region: Secondary | ICD-10-CM | POA: Diagnosis not present

## 2021-05-04 DIAGNOSIS — L300 Nummular dermatitis: Secondary | ICD-10-CM | POA: Diagnosis not present

## 2021-05-04 DIAGNOSIS — R531 Weakness: Secondary | ICD-10-CM | POA: Diagnosis not present

## 2021-05-04 DIAGNOSIS — M2560 Stiffness of unspecified joint, not elsewhere classified: Secondary | ICD-10-CM | POA: Diagnosis not present

## 2021-05-11 DIAGNOSIS — C61 Malignant neoplasm of prostate: Secondary | ICD-10-CM | POA: Diagnosis not present

## 2021-05-11 DIAGNOSIS — N401 Enlarged prostate with lower urinary tract symptoms: Secondary | ICD-10-CM | POA: Diagnosis not present

## 2021-05-11 DIAGNOSIS — R300 Dysuria: Secondary | ICD-10-CM | POA: Diagnosis not present

## 2021-05-11 DIAGNOSIS — R3915 Urgency of urination: Secondary | ICD-10-CM | POA: Diagnosis not present

## 2021-05-12 DIAGNOSIS — M47816 Spondylosis without myelopathy or radiculopathy, lumbar region: Secondary | ICD-10-CM | POA: Diagnosis not present

## 2021-05-12 DIAGNOSIS — M2560 Stiffness of unspecified joint, not elsewhere classified: Secondary | ICD-10-CM | POA: Diagnosis not present

## 2021-05-12 DIAGNOSIS — R531 Weakness: Secondary | ICD-10-CM | POA: Diagnosis not present

## 2021-05-12 DIAGNOSIS — M545 Low back pain, unspecified: Secondary | ICD-10-CM | POA: Diagnosis not present

## 2021-05-14 DIAGNOSIS — M2560 Stiffness of unspecified joint, not elsewhere classified: Secondary | ICD-10-CM | POA: Diagnosis not present

## 2021-05-14 DIAGNOSIS — M545 Low back pain, unspecified: Secondary | ICD-10-CM | POA: Diagnosis not present

## 2021-05-14 DIAGNOSIS — M47816 Spondylosis without myelopathy or radiculopathy, lumbar region: Secondary | ICD-10-CM | POA: Diagnosis not present

## 2021-05-14 DIAGNOSIS — R531 Weakness: Secondary | ICD-10-CM | POA: Diagnosis not present

## 2021-05-18 DIAGNOSIS — M545 Low back pain, unspecified: Secondary | ICD-10-CM | POA: Diagnosis not present

## 2021-05-18 DIAGNOSIS — R531 Weakness: Secondary | ICD-10-CM | POA: Diagnosis not present

## 2021-05-18 DIAGNOSIS — M2560 Stiffness of unspecified joint, not elsewhere classified: Secondary | ICD-10-CM | POA: Diagnosis not present

## 2021-05-18 DIAGNOSIS — M47816 Spondylosis without myelopathy or radiculopathy, lumbar region: Secondary | ICD-10-CM | POA: Diagnosis not present

## 2021-05-20 DIAGNOSIS — G4733 Obstructive sleep apnea (adult) (pediatric): Secondary | ICD-10-CM | POA: Diagnosis not present

## 2021-05-21 DIAGNOSIS — Z6838 Body mass index (BMI) 38.0-38.9, adult: Secondary | ICD-10-CM | POA: Diagnosis not present

## 2021-05-21 DIAGNOSIS — M47816 Spondylosis without myelopathy or radiculopathy, lumbar region: Secondary | ICD-10-CM | POA: Diagnosis not present

## 2021-05-21 DIAGNOSIS — M545 Low back pain, unspecified: Secondary | ICD-10-CM | POA: Diagnosis not present

## 2021-05-21 DIAGNOSIS — M2560 Stiffness of unspecified joint, not elsewhere classified: Secondary | ICD-10-CM | POA: Diagnosis not present

## 2021-05-21 DIAGNOSIS — R531 Weakness: Secondary | ICD-10-CM | POA: Diagnosis not present

## 2021-05-21 DIAGNOSIS — R300 Dysuria: Secondary | ICD-10-CM | POA: Diagnosis not present

## 2021-05-21 DIAGNOSIS — N312 Flaccid neuropathic bladder, not elsewhere classified: Secondary | ICD-10-CM | POA: Diagnosis not present

## 2021-05-25 DIAGNOSIS — R531 Weakness: Secondary | ICD-10-CM | POA: Diagnosis not present

## 2021-05-25 DIAGNOSIS — M545 Low back pain, unspecified: Secondary | ICD-10-CM | POA: Diagnosis not present

## 2021-05-25 DIAGNOSIS — M47816 Spondylosis without myelopathy or radiculopathy, lumbar region: Secondary | ICD-10-CM | POA: Diagnosis not present

## 2021-05-25 DIAGNOSIS — M2560 Stiffness of unspecified joint, not elsewhere classified: Secondary | ICD-10-CM | POA: Diagnosis not present

## 2021-05-28 DIAGNOSIS — M47816 Spondylosis without myelopathy or radiculopathy, lumbar region: Secondary | ICD-10-CM | POA: Diagnosis not present

## 2021-05-28 DIAGNOSIS — M2560 Stiffness of unspecified joint, not elsewhere classified: Secondary | ICD-10-CM | POA: Diagnosis not present

## 2021-05-28 DIAGNOSIS — R531 Weakness: Secondary | ICD-10-CM | POA: Diagnosis not present

## 2021-05-28 DIAGNOSIS — M545 Low back pain, unspecified: Secondary | ICD-10-CM | POA: Diagnosis not present

## 2021-06-01 DIAGNOSIS — M2560 Stiffness of unspecified joint, not elsewhere classified: Secondary | ICD-10-CM | POA: Diagnosis not present

## 2021-06-01 DIAGNOSIS — R531 Weakness: Secondary | ICD-10-CM | POA: Diagnosis not present

## 2021-06-01 DIAGNOSIS — M545 Low back pain, unspecified: Secondary | ICD-10-CM | POA: Diagnosis not present

## 2021-06-01 DIAGNOSIS — M47816 Spondylosis without myelopathy or radiculopathy, lumbar region: Secondary | ICD-10-CM | POA: Diagnosis not present

## 2021-06-09 DIAGNOSIS — L210 Seborrhea capitis: Secondary | ICD-10-CM | POA: Diagnosis not present

## 2021-06-09 DIAGNOSIS — R221 Localized swelling, mass and lump, neck: Secondary | ICD-10-CM | POA: Diagnosis not present

## 2021-06-09 DIAGNOSIS — Z6839 Body mass index (BMI) 39.0-39.9, adult: Secondary | ICD-10-CM | POA: Diagnosis not present

## 2021-06-14 DIAGNOSIS — M542 Cervicalgia: Secondary | ICD-10-CM | POA: Diagnosis not present

## 2021-06-14 DIAGNOSIS — R221 Localized swelling, mass and lump, neck: Secondary | ICD-10-CM | POA: Diagnosis not present

## 2021-06-20 DIAGNOSIS — G4733 Obstructive sleep apnea (adult) (pediatric): Secondary | ICD-10-CM | POA: Diagnosis not present

## 2021-07-02 DIAGNOSIS — D170 Benign lipomatous neoplasm of skin and subcutaneous tissue of head, face and neck: Secondary | ICD-10-CM | POA: Diagnosis not present

## 2021-07-08 DIAGNOSIS — F17220 Nicotine dependence, chewing tobacco, uncomplicated: Secondary | ICD-10-CM | POA: Diagnosis not present

## 2021-07-08 DIAGNOSIS — D170 Benign lipomatous neoplasm of skin and subcutaneous tissue of head, face and neck: Secondary | ICD-10-CM | POA: Diagnosis not present

## 2021-07-08 DIAGNOSIS — E119 Type 2 diabetes mellitus without complications: Secondary | ICD-10-CM | POA: Diagnosis not present

## 2021-07-08 DIAGNOSIS — R221 Localized swelling, mass and lump, neck: Secondary | ICD-10-CM | POA: Diagnosis not present

## 2021-07-08 DIAGNOSIS — N189 Chronic kidney disease, unspecified: Secondary | ICD-10-CM | POA: Diagnosis not present

## 2021-07-08 DIAGNOSIS — E1122 Type 2 diabetes mellitus with diabetic chronic kidney disease: Secondary | ICD-10-CM | POA: Diagnosis not present

## 2021-07-12 ENCOUNTER — Ambulatory Visit: Payer: Medicare PPO | Admitting: Podiatry

## 2021-07-12 DIAGNOSIS — E1142 Type 2 diabetes mellitus with diabetic polyneuropathy: Secondary | ICD-10-CM

## 2021-07-12 DIAGNOSIS — B351 Tinea unguium: Secondary | ICD-10-CM | POA: Diagnosis not present

## 2021-07-12 DIAGNOSIS — E1169 Type 2 diabetes mellitus with other specified complication: Secondary | ICD-10-CM | POA: Diagnosis not present

## 2021-07-12 DIAGNOSIS — M5416 Radiculopathy, lumbar region: Secondary | ICD-10-CM | POA: Diagnosis not present

## 2021-07-12 NOTE — Progress Notes (Signed)
  Subjective:  Patient ID: Alexander King, male    DOB: [birth year],  MRN: [id number]  Chief Complaint  Patient presents with   debride    DFC -FBS: 110 a1C: 6.7 CPP: Conn x 6 mo    75 y.o. male presents with the above complaint. History confirmed with patient.  Denies new pedal concerns  New complaint today of numbness on the inside of the right foot - has been bothering for a few weeks. Takes Gabapentin but doesn't help. Does endorse L4/L5 spinal fusion in 2017. Objective:  Physical Exam: warm, good capillary refill, nail exam onychomycosis of the toenails, no trophic changes or ulcerative lesions. DP pulses palpable, PT pulses palpable, and protective sensation absent . Decreased sensation right plantar arch and 1st toe area.  No images are attached to the encounter.  Assessment:   1. 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  Onychomycosis, Diabetes, and DPN -Patient is diabetic with a qualifying condition for at risk foot care.  Procedure: Nail Debridement Type of Debridement: manual, sharp debridement. Instrumentation: Nail nipper, rotary burr. Number of Nails: 10  Radiculopathy -Discussed symptoms possibly due to radiculopathy given anatomic location, unilateral nature. Recc f/u with PCP, consider L spine XR.    No follow-ups on file.

## 2021-07-19 DIAGNOSIS — B952 Enterococcus as the cause of diseases classified elsewhere: Secondary | ICD-10-CM | POA: Diagnosis not present

## 2021-07-19 DIAGNOSIS — N183 Chronic kidney disease, stage 3 unspecified: Secondary | ICD-10-CM | POA: Diagnosis not present

## 2021-07-19 DIAGNOSIS — E1129 Type 2 diabetes mellitus with other diabetic kidney complication: Secondary | ICD-10-CM | POA: Diagnosis not present

## 2021-07-19 DIAGNOSIS — Z79899 Other long term (current) drug therapy: Secondary | ICD-10-CM | POA: Diagnosis not present

## 2021-07-19 DIAGNOSIS — R972 Elevated prostate specific antigen [PSA]: Secondary | ICD-10-CM | POA: Diagnosis not present

## 2021-07-19 DIAGNOSIS — N390 Urinary tract infection, site not specified: Secondary | ICD-10-CM | POA: Diagnosis not present

## 2021-07-20 DIAGNOSIS — G4733 Obstructive sleep apnea (adult) (pediatric): Secondary | ICD-10-CM | POA: Diagnosis not present

## 2021-07-26 DIAGNOSIS — N183 Chronic kidney disease, stage 3 unspecified: Secondary | ICD-10-CM | POA: Diagnosis not present

## 2021-07-26 DIAGNOSIS — N3010 Interstitial cystitis (chronic) without hematuria: Secondary | ICD-10-CM | POA: Diagnosis not present

## 2021-07-26 DIAGNOSIS — C61 Malignant neoplasm of prostate: Secondary | ICD-10-CM | POA: Diagnosis not present

## 2021-07-26 DIAGNOSIS — Z6839 Body mass index (BMI) 39.0-39.9, adult: Secondary | ICD-10-CM | POA: Diagnosis not present

## 2021-07-26 DIAGNOSIS — E1129 Type 2 diabetes mellitus with other diabetic kidney complication: Secondary | ICD-10-CM | POA: Diagnosis not present

## 2021-07-30 DIAGNOSIS — Z23 Encounter for immunization: Secondary | ICD-10-CM | POA: Diagnosis not present

## 2021-08-17 DIAGNOSIS — M25562 Pain in left knee: Secondary | ICD-10-CM | POA: Diagnosis not present

## 2021-08-17 DIAGNOSIS — M1712 Unilateral primary osteoarthritis, left knee: Secondary | ICD-10-CM | POA: Diagnosis not present

## 2021-09-10 DIAGNOSIS — N390 Urinary tract infection, site not specified: Secondary | ICD-10-CM | POA: Diagnosis not present

## 2021-09-10 DIAGNOSIS — Z6841 Body Mass Index (BMI) 40.0 and over, adult: Secondary | ICD-10-CM | POA: Diagnosis not present

## 2021-09-10 DIAGNOSIS — R6889 Other general symptoms and signs: Secondary | ICD-10-CM | POA: Diagnosis not present

## 2021-09-10 DIAGNOSIS — E1122 Type 2 diabetes mellitus with diabetic chronic kidney disease: Secondary | ICD-10-CM | POA: Diagnosis not present

## 2021-09-10 DIAGNOSIS — L210 Seborrhea capitis: Secondary | ICD-10-CM | POA: Diagnosis not present

## 2021-10-07 DIAGNOSIS — E1122 Type 2 diabetes mellitus with diabetic chronic kidney disease: Secondary | ICD-10-CM | POA: Diagnosis not present

## 2021-10-07 DIAGNOSIS — Z794 Long term (current) use of insulin: Secondary | ICD-10-CM | POA: Diagnosis not present

## 2021-10-07 DIAGNOSIS — Z01818 Encounter for other preprocedural examination: Secondary | ICD-10-CM | POA: Diagnosis not present

## 2021-10-07 DIAGNOSIS — N183 Chronic kidney disease, stage 3 unspecified: Secondary | ICD-10-CM | POA: Diagnosis not present

## 2021-10-14 ENCOUNTER — Ambulatory Visit: Payer: Medicare PPO | Admitting: Podiatry

## 2021-10-14 ENCOUNTER — Encounter: Payer: Self-pay | Admitting: Podiatry

## 2021-10-14 DIAGNOSIS — E1169 Type 2 diabetes mellitus with other specified complication: Secondary | ICD-10-CM | POA: Diagnosis not present

## 2021-10-14 DIAGNOSIS — I872 Venous insufficiency (chronic) (peripheral): Secondary | ICD-10-CM

## 2021-10-14 DIAGNOSIS — E1142 Type 2 diabetes mellitus with diabetic polyneuropathy: Secondary | ICD-10-CM

## 2021-10-14 DIAGNOSIS — B351 Tinea unguium: Secondary | ICD-10-CM

## 2021-10-14 NOTE — Progress Notes (Signed)
°  Subjective:  Patient ID: Alexander King, male    DOB: 1946-08-25,  MRN: [id number]  Chief Complaint  Patient presents with   Nail Problem    Trim nails    Foot Pain    I have some redness to the top of both feet and has been going on for about 3 to 4 months   76 y.o. male presents with the above complaint. History confirmed with patient.  Denies new pedal concerns Objective:  Physical Exam: warm, good capillary refill, nail exam onychomycosis of the toenails, no trophic changes or ulcerative lesions. DP pulses palpable, PT pulses palpable, and protective sensation absent . Decreased sensation right plantar arch and 1st toe area.  Medial arch varicosities; pitting edema bilaterally  No images are attached to the encounter.  Assessment:   1. 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 Venous reflux     Plan:  Patient was evaluated and treated and all questions answered.  Onychomycosis, Diabetes, and DPN -Patient is diabetic with a qualifying condition for at risk foot care.  Procedure: Nail Debridement Type of Debridement: manual, sharp debridement. Instrumentation: Nail nipper, rotary burr. Number of Nails: 10   Venous Reflux -Order reflux studies given venous stasis and pitting edema  Return in about 1 month (around 11/14/2021) for f/u vascular studies.

## 2021-10-15 NOTE — Addendum Note (Signed)
Addended by: Cranford Mon R on: 10/15/2021 11:39 AM   Modules accepted: Orders

## 2021-10-28 DIAGNOSIS — E1129 Type 2 diabetes mellitus with other diabetic kidney complication: Secondary | ICD-10-CM | POA: Diagnosis not present

## 2021-11-02 ENCOUNTER — Ambulatory Visit (HOSPITAL_COMMUNITY)
Admission: RE | Admit: 2021-11-02 | Payer: Medicare PPO | Source: Ambulatory Visit | Attending: Podiatry | Admitting: Podiatry

## 2021-11-04 DIAGNOSIS — M7062 Trochanteric bursitis, left hip: Secondary | ICD-10-CM | POA: Diagnosis not present

## 2021-11-08 DIAGNOSIS — M1712 Unilateral primary osteoarthritis, left knee: Secondary | ICD-10-CM | POA: Diagnosis not present

## 2021-11-08 DIAGNOSIS — G8918 Other acute postprocedural pain: Secondary | ICD-10-CM | POA: Diagnosis not present

## 2021-11-16 DIAGNOSIS — M79662 Pain in left lower leg: Secondary | ICD-10-CM | POA: Diagnosis not present

## 2021-11-16 DIAGNOSIS — M7989 Other specified soft tissue disorders: Secondary | ICD-10-CM | POA: Diagnosis not present

## 2021-11-16 DIAGNOSIS — Z96652 Presence of left artificial knee joint: Secondary | ICD-10-CM | POA: Diagnosis not present

## 2021-11-18 DIAGNOSIS — Z96652 Presence of left artificial knee joint: Secondary | ICD-10-CM | POA: Diagnosis not present

## 2021-11-19 DIAGNOSIS — M25561 Pain in right knee: Secondary | ICD-10-CM | POA: Diagnosis not present

## 2021-11-19 DIAGNOSIS — M25462 Effusion, left knee: Secondary | ICD-10-CM | POA: Diagnosis not present

## 2021-11-19 DIAGNOSIS — Z4733 Aftercare following explantation of knee joint prosthesis: Secondary | ICD-10-CM | POA: Diagnosis not present

## 2021-11-19 DIAGNOSIS — M62552 Muscle wasting and atrophy, not elsewhere classified, left thigh: Secondary | ICD-10-CM | POA: Diagnosis not present

## 2021-11-19 DIAGNOSIS — R2689 Other abnormalities of gait and mobility: Secondary | ICD-10-CM | POA: Diagnosis not present

## 2021-11-22 DIAGNOSIS — M25561 Pain in right knee: Secondary | ICD-10-CM | POA: Diagnosis not present

## 2021-11-22 DIAGNOSIS — R2689 Other abnormalities of gait and mobility: Secondary | ICD-10-CM | POA: Diagnosis not present

## 2021-11-22 DIAGNOSIS — M62552 Muscle wasting and atrophy, not elsewhere classified, left thigh: Secondary | ICD-10-CM | POA: Diagnosis not present

## 2021-11-22 DIAGNOSIS — Z4733 Aftercare following explantation of knee joint prosthesis: Secondary | ICD-10-CM | POA: Diagnosis not present

## 2021-11-22 DIAGNOSIS — M25462 Effusion, left knee: Secondary | ICD-10-CM | POA: Diagnosis not present

## 2021-11-24 DIAGNOSIS — Z4733 Aftercare following explantation of knee joint prosthesis: Secondary | ICD-10-CM | POA: Diagnosis not present

## 2021-11-24 DIAGNOSIS — R2689 Other abnormalities of gait and mobility: Secondary | ICD-10-CM | POA: Diagnosis not present

## 2021-11-24 DIAGNOSIS — M25561 Pain in right knee: Secondary | ICD-10-CM | POA: Diagnosis not present

## 2021-11-24 DIAGNOSIS — M25462 Effusion, left knee: Secondary | ICD-10-CM | POA: Diagnosis not present

## 2021-11-24 DIAGNOSIS — M62552 Muscle wasting and atrophy, not elsewhere classified, left thigh: Secondary | ICD-10-CM | POA: Diagnosis not present

## 2021-11-26 DIAGNOSIS — M25462 Effusion, left knee: Secondary | ICD-10-CM | POA: Diagnosis not present

## 2021-11-26 DIAGNOSIS — M25561 Pain in right knee: Secondary | ICD-10-CM | POA: Diagnosis not present

## 2021-11-26 DIAGNOSIS — R2689 Other abnormalities of gait and mobility: Secondary | ICD-10-CM | POA: Diagnosis not present

## 2021-11-26 DIAGNOSIS — Z4733 Aftercare following explantation of knee joint prosthesis: Secondary | ICD-10-CM | POA: Diagnosis not present

## 2021-11-26 DIAGNOSIS — M62552 Muscle wasting and atrophy, not elsewhere classified, left thigh: Secondary | ICD-10-CM | POA: Diagnosis not present

## 2021-11-30 DIAGNOSIS — R2689 Other abnormalities of gait and mobility: Secondary | ICD-10-CM | POA: Diagnosis not present

## 2021-11-30 DIAGNOSIS — M25561 Pain in right knee: Secondary | ICD-10-CM | POA: Diagnosis not present

## 2021-11-30 DIAGNOSIS — M62552 Muscle wasting and atrophy, not elsewhere classified, left thigh: Secondary | ICD-10-CM | POA: Diagnosis not present

## 2021-11-30 DIAGNOSIS — Z4733 Aftercare following explantation of knee joint prosthesis: Secondary | ICD-10-CM | POA: Diagnosis not present

## 2021-11-30 DIAGNOSIS — M25462 Effusion, left knee: Secondary | ICD-10-CM | POA: Diagnosis not present

## 2021-12-03 DIAGNOSIS — R2689 Other abnormalities of gait and mobility: Secondary | ICD-10-CM | POA: Diagnosis not present

## 2021-12-03 DIAGNOSIS — Z4733 Aftercare following explantation of knee joint prosthesis: Secondary | ICD-10-CM | POA: Diagnosis not present

## 2021-12-03 DIAGNOSIS — M62552 Muscle wasting and atrophy, not elsewhere classified, left thigh: Secondary | ICD-10-CM | POA: Diagnosis not present

## 2021-12-03 DIAGNOSIS — M25462 Effusion, left knee: Secondary | ICD-10-CM | POA: Diagnosis not present

## 2021-12-03 DIAGNOSIS — M25561 Pain in right knee: Secondary | ICD-10-CM | POA: Diagnosis not present

## 2021-12-06 ENCOUNTER — Ambulatory Visit: Payer: Medicare PPO | Admitting: Podiatry

## 2021-12-07 DIAGNOSIS — M25462 Effusion, left knee: Secondary | ICD-10-CM | POA: Diagnosis not present

## 2021-12-07 DIAGNOSIS — R2689 Other abnormalities of gait and mobility: Secondary | ICD-10-CM | POA: Diagnosis not present

## 2021-12-07 DIAGNOSIS — M25561 Pain in right knee: Secondary | ICD-10-CM | POA: Diagnosis not present

## 2021-12-07 DIAGNOSIS — M62552 Muscle wasting and atrophy, not elsewhere classified, left thigh: Secondary | ICD-10-CM | POA: Diagnosis not present

## 2021-12-07 DIAGNOSIS — Z4733 Aftercare following explantation of knee joint prosthesis: Secondary | ICD-10-CM | POA: Diagnosis not present

## 2021-12-10 DIAGNOSIS — M25462 Effusion, left knee: Secondary | ICD-10-CM | POA: Diagnosis not present

## 2021-12-10 DIAGNOSIS — M62552 Muscle wasting and atrophy, not elsewhere classified, left thigh: Secondary | ICD-10-CM | POA: Diagnosis not present

## 2021-12-10 DIAGNOSIS — R2689 Other abnormalities of gait and mobility: Secondary | ICD-10-CM | POA: Diagnosis not present

## 2021-12-10 DIAGNOSIS — M25561 Pain in right knee: Secondary | ICD-10-CM | POA: Diagnosis not present

## 2021-12-10 DIAGNOSIS — Z4733 Aftercare following explantation of knee joint prosthesis: Secondary | ICD-10-CM | POA: Diagnosis not present

## 2021-12-14 DIAGNOSIS — M62552 Muscle wasting and atrophy, not elsewhere classified, left thigh: Secondary | ICD-10-CM | POA: Diagnosis not present

## 2021-12-14 DIAGNOSIS — R2689 Other abnormalities of gait and mobility: Secondary | ICD-10-CM | POA: Diagnosis not present

## 2021-12-14 DIAGNOSIS — M25561 Pain in right knee: Secondary | ICD-10-CM | POA: Diagnosis not present

## 2021-12-14 DIAGNOSIS — M25462 Effusion, left knee: Secondary | ICD-10-CM | POA: Diagnosis not present

## 2021-12-14 DIAGNOSIS — Z4733 Aftercare following explantation of knee joint prosthesis: Secondary | ICD-10-CM | POA: Diagnosis not present

## 2021-12-17 DIAGNOSIS — R2689 Other abnormalities of gait and mobility: Secondary | ICD-10-CM | POA: Diagnosis not present

## 2021-12-17 DIAGNOSIS — M62552 Muscle wasting and atrophy, not elsewhere classified, left thigh: Secondary | ICD-10-CM | POA: Diagnosis not present

## 2021-12-17 DIAGNOSIS — Z4733 Aftercare following explantation of knee joint prosthesis: Secondary | ICD-10-CM | POA: Diagnosis not present

## 2021-12-17 DIAGNOSIS — M25462 Effusion, left knee: Secondary | ICD-10-CM | POA: Diagnosis not present

## 2021-12-17 DIAGNOSIS — M25561 Pain in right knee: Secondary | ICD-10-CM | POA: Diagnosis not present

## 2021-12-20 DIAGNOSIS — E1129 Type 2 diabetes mellitus with other diabetic kidney complication: Secondary | ICD-10-CM | POA: Diagnosis not present

## 2021-12-20 DIAGNOSIS — R972 Elevated prostate specific antigen [PSA]: Secondary | ICD-10-CM | POA: Diagnosis not present

## 2021-12-20 DIAGNOSIS — N390 Urinary tract infection, site not specified: Secondary | ICD-10-CM | POA: Diagnosis not present

## 2021-12-21 DIAGNOSIS — M25462 Effusion, left knee: Secondary | ICD-10-CM | POA: Diagnosis not present

## 2021-12-21 DIAGNOSIS — M25561 Pain in right knee: Secondary | ICD-10-CM | POA: Diagnosis not present

## 2021-12-21 DIAGNOSIS — M62552 Muscle wasting and atrophy, not elsewhere classified, left thigh: Secondary | ICD-10-CM | POA: Diagnosis not present

## 2021-12-21 DIAGNOSIS — Z4733 Aftercare following explantation of knee joint prosthesis: Secondary | ICD-10-CM | POA: Diagnosis not present

## 2021-12-21 DIAGNOSIS — R2689 Other abnormalities of gait and mobility: Secondary | ICD-10-CM | POA: Diagnosis not present

## 2021-12-24 DIAGNOSIS — M62552 Muscle wasting and atrophy, not elsewhere classified, left thigh: Secondary | ICD-10-CM | POA: Diagnosis not present

## 2021-12-24 DIAGNOSIS — Z4733 Aftercare following explantation of knee joint prosthesis: Secondary | ICD-10-CM | POA: Diagnosis not present

## 2021-12-24 DIAGNOSIS — M25462 Effusion, left knee: Secondary | ICD-10-CM | POA: Diagnosis not present

## 2021-12-24 DIAGNOSIS — R2689 Other abnormalities of gait and mobility: Secondary | ICD-10-CM | POA: Diagnosis not present

## 2021-12-24 DIAGNOSIS — M25561 Pain in right knee: Secondary | ICD-10-CM | POA: Diagnosis not present

## 2021-12-27 DIAGNOSIS — Z1331 Encounter for screening for depression: Secondary | ICD-10-CM | POA: Diagnosis not present

## 2021-12-27 DIAGNOSIS — N183 Chronic kidney disease, stage 3 unspecified: Secondary | ICD-10-CM | POA: Diagnosis not present

## 2021-12-27 DIAGNOSIS — Z0000 Encounter for general adult medical examination without abnormal findings: Secondary | ICD-10-CM | POA: Diagnosis not present

## 2021-12-27 DIAGNOSIS — L2089 Other atopic dermatitis: Secondary | ICD-10-CM | POA: Diagnosis not present

## 2021-12-27 DIAGNOSIS — Z6841 Body Mass Index (BMI) 40.0 and over, adult: Secondary | ICD-10-CM | POA: Diagnosis not present

## 2021-12-27 DIAGNOSIS — R221 Localized swelling, mass and lump, neck: Secondary | ICD-10-CM | POA: Diagnosis not present

## 2021-12-27 DIAGNOSIS — E1122 Type 2 diabetes mellitus with diabetic chronic kidney disease: Secondary | ICD-10-CM | POA: Diagnosis not present

## 2021-12-27 DIAGNOSIS — N390 Urinary tract infection, site not specified: Secondary | ICD-10-CM | POA: Diagnosis not present

## 2021-12-29 DIAGNOSIS — M542 Cervicalgia: Secondary | ICD-10-CM | POA: Diagnosis not present

## 2021-12-30 DIAGNOSIS — M25462 Effusion, left knee: Secondary | ICD-10-CM | POA: Diagnosis not present

## 2021-12-30 DIAGNOSIS — R2689 Other abnormalities of gait and mobility: Secondary | ICD-10-CM | POA: Diagnosis not present

## 2021-12-30 DIAGNOSIS — R221 Localized swelling, mass and lump, neck: Secondary | ICD-10-CM | POA: Diagnosis not present

## 2021-12-30 DIAGNOSIS — Z4733 Aftercare following explantation of knee joint prosthesis: Secondary | ICD-10-CM | POA: Diagnosis not present

## 2021-12-30 DIAGNOSIS — M62552 Muscle wasting and atrophy, not elsewhere classified, left thigh: Secondary | ICD-10-CM | POA: Diagnosis not present

## 2021-12-30 DIAGNOSIS — M25561 Pain in right knee: Secondary | ICD-10-CM | POA: Diagnosis not present

## 2022-01-03 DIAGNOSIS — M62552 Muscle wasting and atrophy, not elsewhere classified, left thigh: Secondary | ICD-10-CM | POA: Diagnosis not present

## 2022-01-03 DIAGNOSIS — M25561 Pain in right knee: Secondary | ICD-10-CM | POA: Diagnosis not present

## 2022-01-03 DIAGNOSIS — M25462 Effusion, left knee: Secondary | ICD-10-CM | POA: Diagnosis not present

## 2022-01-03 DIAGNOSIS — R2689 Other abnormalities of gait and mobility: Secondary | ICD-10-CM | POA: Diagnosis not present

## 2022-01-03 DIAGNOSIS — Z4733 Aftercare following explantation of knee joint prosthesis: Secondary | ICD-10-CM | POA: Diagnosis not present

## 2022-01-05 DIAGNOSIS — M542 Cervicalgia: Secondary | ICD-10-CM | POA: Diagnosis not present

## 2022-01-06 DIAGNOSIS — R2689 Other abnormalities of gait and mobility: Secondary | ICD-10-CM | POA: Diagnosis not present

## 2022-01-06 DIAGNOSIS — M25561 Pain in right knee: Secondary | ICD-10-CM | POA: Diagnosis not present

## 2022-01-06 DIAGNOSIS — M62552 Muscle wasting and atrophy, not elsewhere classified, left thigh: Secondary | ICD-10-CM | POA: Diagnosis not present

## 2022-01-06 DIAGNOSIS — Z4733 Aftercare following explantation of knee joint prosthesis: Secondary | ICD-10-CM | POA: Diagnosis not present

## 2022-01-06 DIAGNOSIS — M25462 Effusion, left knee: Secondary | ICD-10-CM | POA: Diagnosis not present

## 2022-01-11 DIAGNOSIS — M542 Cervicalgia: Secondary | ICD-10-CM | POA: Diagnosis not present

## 2022-01-11 DIAGNOSIS — Z6841 Body Mass Index (BMI) 40.0 and over, adult: Secondary | ICD-10-CM | POA: Diagnosis not present

## 2022-01-13 ENCOUNTER — Ambulatory Visit: Payer: Medicare PPO | Admitting: Podiatry

## 2022-01-13 ENCOUNTER — Encounter: Payer: Self-pay | Admitting: Podiatry

## 2022-01-13 DIAGNOSIS — E1169 Type 2 diabetes mellitus with other specified complication: Secondary | ICD-10-CM

## 2022-01-13 DIAGNOSIS — E1140 Type 2 diabetes mellitus with diabetic neuropathy, unspecified: Secondary | ICD-10-CM | POA: Diagnosis not present

## 2022-01-13 DIAGNOSIS — M2011 Hallux valgus (acquired), right foot: Secondary | ICD-10-CM | POA: Diagnosis not present

## 2022-01-13 DIAGNOSIS — E1142 Type 2 diabetes mellitus with diabetic polyneuropathy: Secondary | ICD-10-CM

## 2022-01-13 DIAGNOSIS — M2012 Hallux valgus (acquired), left foot: Secondary | ICD-10-CM | POA: Diagnosis not present

## 2022-01-13 DIAGNOSIS — B351 Tinea unguium: Secondary | ICD-10-CM

## 2022-01-13 DIAGNOSIS — E119 Type 2 diabetes mellitus without complications: Secondary | ICD-10-CM | POA: Diagnosis not present

## 2022-01-13 DIAGNOSIS — L600 Ingrowing nail: Secondary | ICD-10-CM | POA: Diagnosis not present

## 2022-01-13 NOTE — Patient Instructions (Signed)
It was my pleasure serving you today! You had your annual diabetic foot examination performed today. You will notice a charge for an office visit on today's billing and this is for your annual diabetic foot exam. It is done once yearly.  ? ?Diabetes Mellitus and Foot Care ?Foot care is an important part of your health, especially when you have diabetes. Diabetes may cause you to have problems because of poor blood flow (circulation) to your feet and legs, which can cause your skin to: ?Become thinner and drier. ?Break more easily. ?Heal more slowly. ?Peel and crack. ?You may also have nerve damage (neuropathy) in your legs and feet, causing decreased feeling in them. This means that you may not notice minor injuries to your feet that could lead to more serious problems. Noticing and addressing any potential problems early is the best way to prevent future foot problems. ?How to care for your feet ?Foot hygiene ? ?Wash your feet daily with warm water and mild soap. Do not use hot water. Then, pat your feet and the areas between your toes until they are completely dry. Do not soak your feet as this can dry your skin. ?Trim your toenails straight across. Do not dig under them or around the cuticle. File the edges of your nails with an emery board or nail file. ?Apply a moisturizing lotion or petroleum jelly to the skin on your feet and to dry, brittle toenails. Use lotion that does not contain alcohol and is unscented. Do not apply lotion between your toes. ?Shoes and socks ?Wear clean socks or stockings every day. Make sure they are not too tight. Do not wear knee-high stockings since they may decrease blood flow to your legs. ?Wear shoes that fit properly and have enough cushioning. Always look in your shoes before you put them on to be sure there are no objects inside. ?To break in new shoes, wear them for just a few hours a day. This prevents injuries on your feet. ?Wounds, scrapes, corns, and calluses ? ?Check  your feet daily for blisters, cuts, bruises, sores, and redness. If you cannot see the bottom of your feet, use a mirror or ask someone for help. ?Do not cut corns or calluses or try to remove them with medicine. ?If you find a minor scrape, cut, or break in the skin on your feet, keep it and the skin around it clean and dry. You may clean these areas with mild soap and water. Do not clean the area with peroxide, alcohol, or iodine. ?If you have a wound, scrape, corn, or callus on your foot, look at it several times a day to make sure it is healing and not infected. Check for: ?Redness, swelling, or pain. ?Fluid or blood. ?Warmth. ?Pus or a bad smell. ?General tips ?Do not cross your legs. This may decrease blood flow to your feet. ?Do not use heating pads or hot water bottles on your feet. They may burn your skin. If you have lost feeling in your feet or legs, you may not know this is happening until it is too late. ?Protect your feet from hot and cold by wearing shoes, such as at the beach or on hot pavement. ?Schedule a complete foot exam at least once a year (annually) or more often if you have foot problems. Report any cuts, sores, or bruises to your health care provider immediately. ?Where to find more information ?American Diabetes Association: www.diabetes.org ?Association of Diabetes Care & Education Specialists: www.diabeteseducator.org ?Contact  a health care provider if: ?You have a medical condition that increases your risk of infection and you have any cuts, sores, or bruises on your feet. ?You have an injury that is not healing. ?You have redness on your legs or feet. ?You feel burning or tingling in your legs or feet. ?You have pain or cramps in your legs and feet. ?Your legs or feet are numb. ?Your feet always feel cold. ?You have pain around any toenails. ?Get help right away if: ?You have a wound, scrape, corn, or callus on your foot and: ?You have pain, swelling, or redness that gets worse. ?You  have fluid or blood coming from the wound, scrape, corn, or callus. ?Your wound, scrape, corn, or callus feels warm to the touch. ?You have pus or a bad smell coming from the wound, scrape, corn, or callus. ?You have a fever. ?You have a red line going up your leg. ?Summary ?Check your feet every day for blisters, cuts, bruises, sores, and redness. ?Apply a moisturizing lotion or petroleum jelly to the skin on your feet and to dry, brittle toenails. ?Wear shoes that fit properly and have enough cushioning. ?If you have foot problems, report any cuts, sores, or bruises to your health care provider immediately. ?Schedule a complete foot exam at least once a year (annually) or more often if you have foot problems. ?This information is not intended to replace advice given to you by your health care provider. Make sure you discuss any questions you have with your health care provider. ?Document Revised: 04/02/2020 Document Reviewed: 04/02/2020 ?Elsevier Patient Education ? [place]. ? ?Diabetic Neuropathy ?Diabetic neuropathy refers to nerve damage that is caused by diabetes. Over time, people with diabetes can develop nerve damage throughout the body. There are several types of diabetic neuropathy: ?Peripheral neuropathy. This is the most common type of diabetic neuropathy. It damages the nerves that carry signals between the spinal cord and other parts of the body (peripheral nerves). This usually affects nerves in the feet, legs, hands, and arms. ?Autonomic neuropathy. This type causes damage to nerves that control involuntary functions (autonomic nerves). Involuntary functions are functions of the body that you do not control. They include heartbeat, body temperature, blood pressure, urination, digestion, sweating, sexual function, or response to changes in blood glucose. ?Focal neuropathy. This type of nerve damage affects one area of the body, such as an arm, a leg, or the face. The injury may involve one  nerve or a small group of nerves. Focal neuropathy can be painful and unpredictable. It occurs most often in older adults with diabetes. This often develops suddenly, but usually improves over time and does not cause long-term problems. ?Proximal neuropathy. This type of nerve damage affects the nerves of the thighs, hips, buttocks, or legs. It causes severe pain, weakness, and muscle death (atrophy), usually in the thigh muscles. It is more common among older men and people who have type 2 diabetes. The length of recovery time may vary. ?What are the causes? ?Peripheral, autonomic, and focal neuropathies are caused by diabetes that is not well controlled with treatment. The cause of proximal neuropathy is not known, but it may be caused by inflammation related to uncontrolled blood glucose levels. ?What are the signs or symptoms? ?Peripheral neuropathy ?Peripheral neuropathy develops slowly over time. When the nerves of the feet and legs no longer work, you may experience: ?Burning, stabbing, or aching pain in the legs or feet. ?Pain or cramping in the legs or  feet. ?Loss of feeling (numbness) and inability to feel pressure or pain in the feet. This can lead to: ?Thick calluses or sores on areas of constant pressure. ?Ulcers. ?Reduced ability to feel temperature changes. ?Foot deformities. ?Muscle weakness. ?Loss of balance or coordination. ?Autonomic neuropathy ?The symptoms of autonomic neuropathy vary depending on which nerves are affected. Symptoms may include: ?Problems with digestion, such as: ?Nausea or vomiting. ?Poor appetite. ?Bloating. ?Diarrhea or constipation. ?Trouble swallowing. ?Losing weight without trying to. ?Problems with the heart, blood, and lungs, such as: ?Dizziness, especially when standing up. ?Fainting. ?Shortness of breath. ?Irregular heartbeat. ?Bladder problems, such as: ?Trouble starting or stopping urination. ?Leaking urine. ?Trouble emptying the bladder. ?Urinary tract infections  (UTIs). ?Problems with other body functions, such as: ?Sweat. You may sweat too much or too little. ?Temperature. You might get hot easily. Or, you might feel cold more than usual. ?Sexual function. Men may

## 2022-01-23 NOTE — Progress Notes (Signed)
ANNUAL DIABETIC FOOT EXAM ? ?Subjective: ?Alexander King presents today for annual diabetic foot examination. ? ?Patient relates 30 year h/o diabetes. ? ?Patient denies any h/o foot wounds. ? ?Patient has been diagnosed with neuropathy and it is managed with gabapentin. ? ?Risk factors: diabetes, HTN. ? ?Alexander Flow, MD is patient's PCP. Last visit was 01/03/2022. ? ?Patient had left knee surgery in February. ? ?Today, he relates his great toes are sore. Denies any redness, drainage or swelling. ? ?History reviewed. No pertinent past medical history. ?Patient Active Problem List  ? Diagnosis Date Noted  ? Solitary kidney 06/24/2020  ? 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 Long-term insulin use in type 2 diabetes ([place]) 03/19/2020  ? Unilateral primary osteoarthritis, left knee 05/06/2019  ? Hyperlipidemia 06/26/2017  ? Hypertension 06/26/2017  ? Sleep apnea 06/26/2017  ? Malignant neoplasm of prostate ([place]) 05/02/2017  ? Tachycardia 06/12/2016  ? Acute kidney injury (nontraumatic) ([place]) 06/09/2016  ? Gross hematuria 06/09/2016  ? Urinary tract infection with hematuria 06/09/2016  ? 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 Testicular lesion 06/08/2016  ? Urge incontinence 06/08/2016  ? Continuous leakage of urine 04/05/2016  ? Lumbar radiculopathy 04/05/2016  ? Chronic pain of left knee 07/14/2015  ? Disc degeneration, lumbar 06/08/2015  ? Osteoarthritis of spine with radiculopathy, lumbar region 06/08/2015  ? Spondylolisthesis, lumbar region 06/08/2015  ? ?History reviewed. No pertinent surgical history. ?Current Outpatient Medications on File Prior to Visit  ?Medication Sig Dispense Refill  ? aspirin 81 MG EC tablet Take by mouth.    ? betaxolol (KERLONE) 10 MG tablet Take by mouth.    ? gabapentin (NEURONTIN) 600 MG tablet Take by mouth.    ? lovastatin (MEVACOR) 40 MG tablet Take by mouth.    ? pantoprazole (PROTONIX) 40 MG tablet Take by mouth.    ? triamcinolone acetonide (TRIESENCE) 40 MG/ML  SUSP Inject into the articular space.    ? amLODipine (NORVASC) 5 MG tablet Take by mouth.    ? citalopram (CELEXA) 10 MG tablet Take 10 mg by mouth daily.    ? Docusate Sodium 100 MG capsule Take 100 mg by mouth 2 (two) times daily.    ? fluticasone (FLONASE) 50 MCG/ACT nasal spray Place into the nose.    ? fosfomycin (MONUROL) 3 g PACK Take by mouth.    ? HUMALOG KWIKPEN 100 UNIT/ML KwikPen     ? insulin glargine, 1 Unit Dial, (TOUJEO SOLOSTAR) 300 UNIT/ML Solostar Pen Inject into the skin.    ? ketoconazole (NIZORAL) 2 % cream APPLY TO AFFECTED AREA TWICE A DAY    ? Mirabegron (MYRBETRIQ PO) Take by mouth.    ? NOVOFINE PLUS 32G X 4 MM MISC USE 6 TIMES DAILY AS DIRECTED    ? nystatin cream (MYCOSTATIN)     ? Omega-3 Fatty Acids (FISH OIL) 1360 MG CAPS Take by mouth.    ? ONETOUCH ULTRA test strip 2 (two) times daily. use for testing    ? tacrolimus (PROTOPIC) 0.1 % ointment Apply topically.    ? VICTOZA 18 MG/3ML SOPN     ? ?No current facility-administered medications on file prior to visit.  ?  ?Allergies  ?Allergen Reactions  ? Sulfamethoxazole-Trimethoprim Hives, Itching and Rash  ?  HIVES AND ITCHING ?HIVES AND ITCHING ?  ? Nitrofurantoin Nausea And Vomiting  ? ?Social History  ? ?Occupational History  ? Not on file  ?Tobacco Use  ? Smoking status: Never  ? Smokeless tobacco: Current  ?  Types: Chew  ?Substance and Sexual Activity  ? Alcohol use: Not Currently  ? Drug use: Never  ? Sexual activity: Not Currently  ? ?History reviewed. No pertinent family history. ? ?There is no immunization history on file for this patient.  ? ?Review of Systems: Negative except as noted in the HPI.  ? ?Objective: ?There were no vitals filed for this visit. ? ?Alexander King is a pleasant 76 y.o. male in NAD. AAO X 3. ? ?Vascular Examination: ?CFT <3 seconds b/l LE. Palpable pedal pulses b/l LE. Pedal hair sparse. No pain with calf compression b/l. Lower extremity skin temperature gradient within normal limits. Nonpitting edema  noted BLE. No cyanosis or clubbing noted b/l LE. ? ?Dermatological Examination: ?Pedal integument with normal turgor, texture and tone b/l LE. No open wounds b/l. No interdigital macerations b/l. Toenails 2-5 bilaterally elongated, thickened, discolored with subungual debris. +Tenderness with dorsal palpation of nailplates. No hyperkeratotic or porokeratotic lesions present. Incurvated nailplate both borders of left hallux and both borders of right hallux.  Nail border hypertrophy minimal. There is tenderness to palpation. Sign(s) of infection: no clinical signs of infection noted on examination today.. ? ?Neurological Examination: ?Protective sensation diminished with 10g monofilament b/l. Vibratory sensation intact b/l. ? ?Musculoskeletal Examination: ?Normal muscle strength 5/5 to all lower extremity muscle groups bilaterally. No pain, crepitus or joint limitation noted with ROM b/l LE. No gross bony pedal deformities b/l. Patient ambulates independently without assistive aids. ? ?Footwear Assessment: ?Does the patient wear appropriate shoes? Yes. ?Does the patient need inserts/orthotics? No. ? ?Assessment: ?1. 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 Ingrown toenail without infection   ?3. Hallux valgus, acquired, bilateral   ?4. Type 2 diabetes mellitus with diabetic neuropathy, without long-term current use of insulin ([place])   ?5. Encounter for diabetic foot exam ([place])   ? ?ADA Risk Categorization: ? ?High Risk  ?Patient has one or more of the following: ?Loss of protective sensation ?Absent pedal pulses ?Severe Foot deformity ?History of foot ulcer ? ?Plan: ?-Patient was evaluated and treated. All patient's and/or POA's questions/concerns answered on today's visit. ?-Diabetic foot examination performed today. ?-Continue foot and shoe inspections daily. Monitor blood glucose per PCP/Endocrinologist's recommendations. ?-Patient to continue soft,  supportive shoe gear daily. ?-Mycotic toenails 2-5 bilaterally were debrided in length and girth with sterile nail nippers and dremel without iatrogenic bleeding. ?-Offending nail border debrided and curretaged bilateral great toes utilizing sterile nail nipper and currette. Border(s) cleansed with alcohol and triple antibiotic ointment applied. Patient instructed to apply Neosporin Cream  to bilateral great toes once daily for 7 days. ?-Patient/POA to call should there be question/concern in the interim. ?Return in about 3 months (around 04/14/2022). ? ?Marzetta Board, DPM ?

## 2022-02-02 DIAGNOSIS — Z6841 Body Mass Index (BMI) 40.0 and over, adult: Secondary | ICD-10-CM | POA: Diagnosis not present

## 2022-02-02 DIAGNOSIS — J0120 Acute ethmoidal sinusitis, unspecified: Secondary | ICD-10-CM | POA: Diagnosis not present

## 2022-02-16 DIAGNOSIS — E119 Type 2 diabetes mellitus without complications: Secondary | ICD-10-CM | POA: Diagnosis not present

## 2022-02-16 DIAGNOSIS — H35373 Puckering of macula, bilateral: Secondary | ICD-10-CM | POA: Diagnosis not present

## 2022-02-16 DIAGNOSIS — H524 Presbyopia: Secondary | ICD-10-CM | POA: Diagnosis not present

## 2022-02-17 DIAGNOSIS — Z471 Aftercare following joint replacement surgery: Secondary | ICD-10-CM | POA: Diagnosis not present

## 2022-02-17 DIAGNOSIS — Z96652 Presence of left artificial knee joint: Secondary | ICD-10-CM | POA: Diagnosis not present

## 2022-04-14 DIAGNOSIS — M7918 Myalgia, other site: Secondary | ICD-10-CM | POA: Diagnosis not present

## 2022-04-14 DIAGNOSIS — M47812 Spondylosis without myelopathy or radiculopathy, cervical region: Secondary | ICD-10-CM | POA: Diagnosis not present

## 2022-04-14 DIAGNOSIS — M47816 Spondylosis without myelopathy or radiculopathy, lumbar region: Secondary | ICD-10-CM | POA: Diagnosis not present

## 2022-04-14 DIAGNOSIS — M48062 Spinal stenosis, lumbar region with neurogenic claudication: Secondary | ICD-10-CM | POA: Diagnosis not present

## 2022-04-14 DIAGNOSIS — M1612 Unilateral primary osteoarthritis, left hip: Secondary | ICD-10-CM | POA: Diagnosis not present

## 2022-04-21 ENCOUNTER — Ambulatory Visit: Payer: Medicare PPO | Admitting: Podiatry

## 2022-04-21 ENCOUNTER — Encounter: Payer: Self-pay | Admitting: Podiatry

## 2022-04-21 DIAGNOSIS — B351 Tinea unguium: Secondary | ICD-10-CM

## 2022-04-21 DIAGNOSIS — E1140 Type 2 diabetes mellitus with diabetic neuropathy, unspecified: Secondary | ICD-10-CM

## 2022-04-21 DIAGNOSIS — M79675 Pain in left toe(s): Secondary | ICD-10-CM

## 2022-04-21 DIAGNOSIS — M79674 Pain in right toe(s): Secondary | ICD-10-CM

## 2022-04-26 ENCOUNTER — Other Ambulatory Visit: Payer: Self-pay

## 2022-04-26 NOTE — Progress Notes (Signed)
  Subjective:  Patient ID: Alexander King, male    DOB: Apr 24, 1946,  MRN: [id number]  Alexander King presents to clinic today for at risk foot care with history of diabetic neuropathy and painful thick toenails that are difficult to trim. Pain interferes with ambulation. Aggravating factors include wearing enclosed shoe gear. Pain is relieved with periodic professional debridement.  Patient states blood glucose was 159 mg/dl today.  Last A1c was 6.9%.  New problem(s): None.   PCP is [name], MD , and last visit was April, 2023.  Allergies  Allergen Reactions   Sulfamethoxazole-Trimethoprim Hives, Itching and Rash    HIVES AND ITCHING HIVES AND ITCHING    Nitrofurantoin Nausea And Vomiting    Review of Systems: Negative except as noted in the HPI.  Objective: No changes noted in today's physical examination. Alexander King is a pleasant 76 y.o. male in NAD. AAO X 3.  Vascular Examination: CFT <3 seconds b/l LE. Palpable pedal pulses b/l LE. Pedal hair sparse. No pain with calf compression b/l. Lower extremity skin temperature gradient within normal limits. Nonpitting edema noted BLE. No cyanosis or clubbing noted b/l LE.  Dermatological Examination: Pedal integument with normal turgor, texture and tone b/l LE. No open wounds b/l. No interdigital macerations b/l. Toenails 2-5 bilaterally elongated, thickened, discolored with subungual debris. +Tenderness with dorsal palpation of nailplates. No hyperkeratotic or porokeratotic lesions present. Incurvated nailplate both borders of left hallux and both borders of right hallux.  Nail border hypertrophy minimal. There is tenderness to palpation. Sign(s) of infection: no clinical signs of infection noted on examination today.  Neurological Examination: Protective sensation diminished with 10g monofilament b/l. Vibratory sensation intact b/l.  Musculoskeletal Examination: Normal muscle strength 5/5 to all lower extremity muscle groups  bilaterally. No pain, crepitus or joint limitation noted with ROM b/l LE. No gross bony pedal deformities b/l. Patient ambulates independently without assistive aids.  Assessment/Plan: 1. Pain due to onychomycosis of toenails of both feet   2. Type 2 diabetes mellitus with diabetic neuropathy, without long-term current use of insulin ([place])   -Examined patient. -No new findings. No new orders. -Toenails 2-5 bilaterally debrided in length and girth without iatrogenic bleeding with sterile nail nipper and dremel.  -Offending nail border debrided and curretaged bilateral great toes utilizing sterile nail nipper and currette. Border cleansed with alcohol and triple antibiotic applied. No further treatment required by patient/caregiver. Call office if there are any concerns. -Patient/POA to call should there be question/concern in the interim.   Return in about 3 months (around 07/22/2022).  Marzetta Board, DPM

## 2022-04-26 NOTE — Patient Outreach (Signed)
  Care Coordination   Outreach  Visit Note   04/26/2022 Name: Teige Rountree MRN: [id number] DOB: [birth year]  Nester Bachus is a 76 y.o. year old male who sees [name], MD for primary care. I spoke with  Conan Bowens by phone today  What matters to the patients health and wellness today?  Called patient to explain [facility] care coordination program. Briefly explained program and patient states that it is not a good time to talk and call back. Booked an appointment to call patient back to further explain program.   SDOH assessments and interventions completed:   No   Care Coordination Interventions Activated:  No Care Coordination Interventions:  No, not indicated  Follow up plan: Follow up call scheduled for 04/28/2022  Encounter Outcome:  Pt. Scheduled  Tomasa Rand, RN, BSN, [place] Coordinator [phone]

## 2022-04-28 ENCOUNTER — Ambulatory Visit: Payer: Self-pay

## 2022-04-28 NOTE — Patient Outreach (Signed)
  Care Coordination   04/28/2022 Name: Alexander King MRN: [id number] DOB: Mar 29, 1946   Care Coordination Outreach Attempts:  A second unsuccessful outreach was attempted today to offer the patient with information about available care coordination services as a benefit of their health plan.    Placed call to patient for scheduled follow up call about interest in program.  No answer and voicemail box is full.  Follow Up Plan:  Additional outreach attempts will be made to offer the patient care coordination information and services.   Encounter Outcome:  No Answer  Care Coordination Interventions Activated:  No   Care Coordination Interventions:  No, not indicated    Tomasa Rand, RN, BSN, CEN [facility] [employer] [phone]

## 2022-04-29 DIAGNOSIS — M48061 Spinal stenosis, lumbar region without neurogenic claudication: Secondary | ICD-10-CM | POA: Diagnosis not present

## 2022-04-29 DIAGNOSIS — M5125 Other intervertebral disc displacement, thoracolumbar region: Secondary | ICD-10-CM | POA: Diagnosis not present

## 2022-04-29 DIAGNOSIS — M48062 Spinal stenosis, lumbar region with neurogenic claudication: Secondary | ICD-10-CM | POA: Diagnosis not present

## 2022-04-29 DIAGNOSIS — M4186 Other forms of scoliosis, lumbar region: Secondary | ICD-10-CM | POA: Diagnosis not present

## 2022-04-29 DIAGNOSIS — M5126 Other intervertebral disc displacement, lumbar region: Secondary | ICD-10-CM | POA: Diagnosis not present

## 2022-05-03 ENCOUNTER — Other Ambulatory Visit: Payer: Self-pay

## 2022-05-03 NOTE — Patient Outreach (Signed)
  Care Coordination   Outreach attempt  Visit Note   05/03/2022 Name: Alexander King MRN: [id number] DOB: 1946/03/28  Carolos King is a 76 y.o. year old male who sees Alexander Flow, MD for primary care. I spoke with  Alexander King by phone today  What matters to the patients health and wellness today?  Patient states that he is eating to call back in an hour   SDOH assessments and interventions completed:      Care Coordination Interventions Activated:  No  Care Coordination Interventions:  No, not indicated   Follow up plan:  request call back in 1 hour    Encounter Outcome:  Pt. Scheduled  Tomasa Rand, RN, BSN, CEN [place] Coordinator [phone]

## 2022-05-03 NOTE — Patient Outreach (Signed)
  Care Coordination   05/03/2022 Name: Alexander King MRN: [id number] DOB: 12/10/1945   Care Coordination Outreach Attempts:  A third unsuccessful outreach was attempted today to offer the patient with information about available care coordination services as a benefit of their health plan.   Follow Up Plan:  No further outreach attempts will be made at this time. We have been unable to contact the patient to offer or enroll patient in care coordination services  Encounter Outcome:  No Answer  Care Coordination Interventions Activated:  No   Care Coordination Interventions:  No, not indicated    Tomasa Rand, RN, BSN, CEN [place] Coordinator [phone]

## 2022-05-04 DIAGNOSIS — C61 Malignant neoplasm of prostate: Secondary | ICD-10-CM | POA: Diagnosis not present

## 2022-05-04 DIAGNOSIS — E1169 Type 2 diabetes mellitus with other specified complication: Secondary | ICD-10-CM | POA: Diagnosis not present

## 2022-05-04 DIAGNOSIS — R7989 Other specified abnormal findings of blood chemistry: Secondary | ICD-10-CM | POA: Diagnosis not present

## 2022-05-04 DIAGNOSIS — R61 Generalized hyperhidrosis: Secondary | ICD-10-CM | POA: Diagnosis not present

## 2022-05-27 DIAGNOSIS — Z6841 Body Mass Index (BMI) 40.0 and over, adult: Secondary | ICD-10-CM | POA: Diagnosis not present

## 2022-05-27 DIAGNOSIS — E1165 Type 2 diabetes mellitus with hyperglycemia: Secondary | ICD-10-CM | POA: Diagnosis not present

## 2022-05-27 DIAGNOSIS — E782 Mixed hyperlipidemia: Secondary | ICD-10-CM | POA: Diagnosis not present

## 2022-05-27 DIAGNOSIS — G629 Polyneuropathy, unspecified: Secondary | ICD-10-CM | POA: Diagnosis not present

## 2022-05-27 DIAGNOSIS — J310 Chronic rhinitis: Secondary | ICD-10-CM | POA: Diagnosis not present

## 2022-05-27 DIAGNOSIS — N183 Chronic kidney disease, stage 3 unspecified: Secondary | ICD-10-CM | POA: Diagnosis not present

## 2022-06-21 NOTE — Patient Instructions (Signed)
Avoid bending, stooping and avoid lifting weights greater than 10 lbs. Avoid prolong standing and walking. Avoid frequent bending and stooping  No lifting greater than 10 lbs. May use ice or moist heat for pain. Weight loss is of benefit. Handicap license is approved. Dr. Junius Roads secretary/Assistant will call to arrange for Sacroiliac injection under ultrasound guidance local and steroid injection

## 2022-06-24 DIAGNOSIS — J329 Chronic sinusitis, unspecified: Secondary | ICD-10-CM | POA: Diagnosis not present

## 2022-06-30 DIAGNOSIS — R300 Dysuria: Secondary | ICD-10-CM | POA: Diagnosis not present

## 2022-07-14 ENCOUNTER — Ambulatory Visit: Payer: Medicare PPO | Admitting: Podiatry

## 2022-07-14 DIAGNOSIS — M79674 Pain in right toe(s): Secondary | ICD-10-CM | POA: Diagnosis not present

## 2022-07-14 DIAGNOSIS — B351 Tinea unguium: Secondary | ICD-10-CM | POA: Diagnosis not present

## 2022-07-14 DIAGNOSIS — E1140 Type 2 diabetes mellitus with diabetic neuropathy, unspecified: Secondary | ICD-10-CM | POA: Diagnosis not present

## 2022-07-14 DIAGNOSIS — M79675 Pain in left toe(s): Secondary | ICD-10-CM | POA: Diagnosis not present

## 2022-07-14 NOTE — Progress Notes (Signed)
  Subjective:  Patient ID: Alexander King, male    DOB: Nov 19, 1945,  MRN: [id number]  Alexander King presents to clinic today for:  Chief Complaint  Patient presents with   Nail Problem    Diabetic foot care BS-109 A1C-7.3 PCP-Jaber Humphrey Rolls PCP VST-5 weeks ago   New problem(s): None.   PCP is [name], MD.  Allergies  Allergen Reactions   Sulfamethoxazole-Trimethoprim Hives, Itching and Rash    HIVES AND ITCHING HIVES AND ITCHING    Nitrofurantoin Nausea And Vomiting    Review of Systems: Negative except as noted in the HPI.  Objective: No changes noted in today's physical examination.  Alexander King is a pleasant 76 y.o. male morbidly obese in NAD. AAO x 3.  Vascular Examination: CFT <3 seconds b/l LE. Palpable pedal pulses b/l LE. Pedal hair sparse. No pain with calf compression b/l. Lower extremity skin temperature gradient within normal limits. Nonpitting edema noted BLE. No cyanosis or clubbing noted b/l LE.  Dermatological Examination: Pedal integument with normal turgor, texture and tone b/l LE. No open wounds b/l. No interdigital macerations b/l. Toenails 2-5 bilaterally elongated, thickened, discolored with subungual debris. +Tenderness with dorsal palpation of nailplates.   No hyperkeratotic or porokeratotic lesions present.   Neurological Examination: Protective sensation diminished with 10g monofilament b/l. Vibratory sensation intact b/l.  Musculoskeletal Examination: Normal muscle strength 5/5 to all lower extremity muscle groups bilaterally. No pain, crepitus or joint limitation noted with ROM b/l LE. No gross bony pedal deformities b/l. Patient ambulates independently without assistive aids.  Assessment/Plan: 1. Pain due to onychomycosis of toenails of both feet   2. Type 2 diabetes mellitus with diabetic neuropathy, without long-term current use of insulin (HCC)     No orders of the defined types were placed in this encounter.   -Patient was evaluated  and treated. All patient's and/or POA's questions/concerns answered on today's visit. -Patient to continue soft, supportive shoe gear daily. -Mycotic toenails 1-5 bilaterally were debrided in length and girth with sterile nail nippers and dremel without incident. -Patient/POA to call should there be question/concern in the interim.   Return in about 3 months (around 10/14/2022).  Marzetta Board, DPM

## 2022-07-15 DIAGNOSIS — E1165 Type 2 diabetes mellitus with hyperglycemia: Secondary | ICD-10-CM | POA: Diagnosis not present

## 2022-07-15 DIAGNOSIS — R972 Elevated prostate specific antigen [PSA]: Secondary | ICD-10-CM | POA: Diagnosis not present

## 2022-07-15 DIAGNOSIS — E782 Mixed hyperlipidemia: Secondary | ICD-10-CM | POA: Diagnosis not present

## 2022-07-15 DIAGNOSIS — N183 Chronic kidney disease, stage 3 unspecified: Secondary | ICD-10-CM | POA: Diagnosis not present

## 2022-07-17 ENCOUNTER — Encounter: Payer: Self-pay | Admitting: Podiatry

## 2022-07-20 DIAGNOSIS — Z6841 Body Mass Index (BMI) 40.0 and over, adult: Secondary | ICD-10-CM | POA: Diagnosis not present

## 2022-07-20 DIAGNOSIS — Z1211 Encounter for screening for malignant neoplasm of colon: Secondary | ICD-10-CM | POA: Diagnosis not present

## 2022-07-20 DIAGNOSIS — I10 Essential (primary) hypertension: Secondary | ICD-10-CM | POA: Diagnosis not present

## 2022-07-20 DIAGNOSIS — E1165 Type 2 diabetes mellitus with hyperglycemia: Secondary | ICD-10-CM | POA: Diagnosis not present

## 2022-07-20 DIAGNOSIS — E782 Mixed hyperlipidemia: Secondary | ICD-10-CM | POA: Diagnosis not present

## 2022-07-20 DIAGNOSIS — Z23 Encounter for immunization: Secondary | ICD-10-CM | POA: Diagnosis not present

## 2022-07-20 DIAGNOSIS — E1142 Type 2 diabetes mellitus with diabetic polyneuropathy: Secondary | ICD-10-CM | POA: Diagnosis not present

## 2022-08-04 ENCOUNTER — Ambulatory Visit: Payer: Medicare PPO | Admitting: Podiatry

## 2022-08-15 DIAGNOSIS — R062 Wheezing: Secondary | ICD-10-CM | POA: Diagnosis not present

## 2022-08-16 DIAGNOSIS — I10 Essential (primary) hypertension: Secondary | ICD-10-CM | POA: Diagnosis not present

## 2022-08-16 DIAGNOSIS — Z6841 Body Mass Index (BMI) 40.0 and over, adult: Secondary | ICD-10-CM | POA: Diagnosis not present

## 2022-08-16 DIAGNOSIS — B372 Candidiasis of skin and nail: Secondary | ICD-10-CM | POA: Diagnosis not present

## 2022-08-16 DIAGNOSIS — N183 Chronic kidney disease, stage 3 unspecified: Secondary | ICD-10-CM | POA: Diagnosis not present

## 2022-08-31 DIAGNOSIS — Z23 Encounter for immunization: Secondary | ICD-10-CM | POA: Diagnosis not present

## 2022-10-17 DIAGNOSIS — R110 Nausea: Secondary | ICD-10-CM | POA: Diagnosis not present

## 2022-10-17 DIAGNOSIS — G4700 Insomnia, unspecified: Secondary | ICD-10-CM | POA: Diagnosis not present

## 2022-10-17 DIAGNOSIS — Z6841 Body Mass Index (BMI) 40.0 and over, adult: Secondary | ICD-10-CM | POA: Diagnosis not present

## 2022-10-27 ENCOUNTER — Ambulatory Visit: Payer: Medicare PPO | Admitting: Podiatry

## 2022-10-27 ENCOUNTER — Encounter: Payer: Self-pay | Admitting: Podiatry

## 2022-10-27 VITALS — BP 127/73

## 2022-10-27 DIAGNOSIS — M79674 Pain in right toe(s): Secondary | ICD-10-CM

## 2022-10-27 DIAGNOSIS — B351 Tinea unguium: Secondary | ICD-10-CM | POA: Diagnosis not present

## 2022-10-27 DIAGNOSIS — L84 Corns and callosities: Secondary | ICD-10-CM

## 2022-10-27 DIAGNOSIS — M792 Neuralgia and neuritis, unspecified: Secondary | ICD-10-CM | POA: Diagnosis not present

## 2022-10-27 DIAGNOSIS — M79675 Pain in left toe(s): Secondary | ICD-10-CM | POA: Diagnosis not present

## 2022-10-27 DIAGNOSIS — E1140 Type 2 diabetes mellitus with diabetic neuropathy, unspecified: Secondary | ICD-10-CM | POA: Diagnosis not present

## 2022-10-27 NOTE — Progress Notes (Signed)
  Subjective:  Patient ID: Alexander King, male    DOB: [birth year],  MRN: [id number]  Alexander King presents to clinic today for at risk foot care with history of diabetic neuropathy and painful elongated mycotic toenails 1-5 bilaterally which are tender when wearing enclosed shoe gear. Pain is relieved with periodic professional debridement.  Chief Complaint  Patient presents with   Nail Problem    DFC BS-149 A1C-6.7 PCP-[place] PCP VST- 3 months ago   Patient would like to discuss treatment options for continued neuropathic pain. He states pain is more symptomatic at night.  PCP is [name], MD.  Allergies  Allergen Reactions   Sulfamethoxazole-Trimethoprim Hives, Itching and Rash    HIVES AND ITCHING HIVES AND ITCHING    Nitrofurantoin Nausea And Vomiting   Review of Systems: Negative except as noted in the HPI.  Objective: No changes noted in today's physical examination. Vitals:   10/27/22 1318  BP: 127/73   Alexander King is a pleasant 77 y.o. male morbidly obese in NAD. AAO x 3.  Vascular Examination: CFT <3 seconds b/l LE. Palpable pedal pulses b/l LE. Pedal hair sparse. No pain with calf compression b/l. Lower extremity skin temperature gradient within normal limits. Nonpitting edema noted BLE. No cyanosis or clubbing noted b/l LE.  Dermatological Examination: Pedal integument with normal turgor, texture and tone b/l LE. No open wounds b/l. No interdigital macerations b/l.   Toenails 2-5 bilaterally elongated, thickened, discolored with subungual debris. +Tenderness with dorsal palpation of nailplates.   Incurvated nailplate both borders of left hallux and both borders of right hallux.  Nail border hypertrophy absent. There is tenderness to palpation. Sign(s) of infection: no clinical signs of infection noted on examination today.  Hyperkeratotic lesion(s) submet head 5 left foot.  No erythema, no edema, no drainage, no fluctuance.  Neurological Examination: Pt has  subjective symptoms of neuropathy. Protective sensation diminished with 10g monofilament b/l. Vibratory sensation intact b/l.  Musculoskeletal Examination: Normal muscle strength 5/5 to all lower extremity muscle groups bilaterally. No pain, crepitus or joint limitation noted with ROM b/l LE. No gross bony pedal deformities b/l. Patient ambulates independently without assistive aids.  Assessment/Plan: 1. Pain due to onychomycosis of toenails of both feet   2. Callus   3. Neuropathic pain   4. Type 2 diabetes mellitus with diabetic neuropathy, without long-term current use of insulin ([place])   -Consent given for treatment as described below: -Examined patient. -Discussed neuropathy symptoms. Advised patient/POA to purchase Nervive Pain Cream or Roll On. Apply to foot/feet before bedtime. -Continue foot and shoe inspections daily. Monitor blood glucose per PCP/Endocrinologist's recommendations. -Continue supportive shoe gear daily. -Toenails 1-5 b/l were debrided in length and girth with sterile nail nippers and dremel without iatrogenic bleeding.  -No invasive procedure(s) performed. Offending nail border debrided and curretaged bilateral great toes utilizing sterile nail nipper and currette. Border(s) cleansed with alcohol and triple antibiotic ointment applied. Patient/POA/Caregiver/Facility instructed to apply Neosporin with Pain Relief  to bilateral great toes once daily for 7 days. Call office if there are any concerns. -Callus(es) submet head 5 left foot pared utilizing sterile scalpel blade without complication or incident. Total number debrided =1. -Patient/POA to call should there be question/concern in the interim.   Return in about 3 months (around 01/25/2023).  Marzetta Board, DPM

## 2022-10-27 NOTE — Patient Instructions (Signed)
Purchase [employer] On or Nervive Cream and apply to feet before bedtime.   Recommend Neurology Consult for continued neuropathic pain.  Peripheral Neuropathy Peripheral neuropathy is a type of nerve damage. It affects nerves that carry signals between the spinal cord and the arms, legs, and the rest of the body (peripheral nerves). It does not affect nerves in the spinal cord or brain. In peripheral neuropathy, one nerve or a group of nerves may be damaged. Peripheral neuropathy is a broad category that includes many specific nerve disorders, like diabetic neuropathy, hereditary neuropathy, and carpal tunnel syndrome. What are the causes? This condition may be caused by: Certain diseases, such as: Diabetes. This is the most common cause of peripheral neuropathy. Autoimmune diseases, such as rheumatoid arthritis and systemic lupus erythematosus. Nerve diseases that are passed from parent to child (inherited). Kidney disease. Thyroid disease. Other causes may include: Nerve injury. Pressure or stress on a nerve that lasts a long time. Lack (deficiency) of B vitamins. This can result from alcoholism, poor diet, or a restricted diet. Infections. Some medicines, such as cancer medicines (chemotherapy). Poisonous (toxic) substances, such as lead and mercury. Too little blood flowing to the legs. In some cases, the cause of this condition is not known. What are the signs or symptoms? Symptoms of this condition depend on which of your nerves is damaged. Symptoms in the legs, hands, and arms can include: Loss of feeling (numbness) in the feet, hands, or both. Tingling in the feet, hands, or both. Burning pain. Very sensitive skin. Weakness. Not being able to move a part of the body (paralysis). Clumsiness or poor coordination. Muscle twitching. Loss of balance. Symptoms in other parts of the body can include: Not being able to control your bladder. Feeling dizzy. Sexual problems. How  is this diagnosed? Diagnosing and finding the cause of peripheral neuropathy can be difficult. Your health care provider will take your medical history and do a physical exam. A neurological exam will also be done. This involves checking things that are affected by your brain, spinal cord, and nerves (nervous system). For example, your health care provider will check your reflexes, how you move, and what you can feel. You may have other tests, such as: Blood tests. Electromyogram (EMG) and nerve conduction tests. These tests check nerve function and how well the nerves are controlling the muscles. Imaging tests, such as a CT scan or MRI, to rule out other causes of your symptoms. Removing a small piece of nerve to be examined in a lab (nerve biopsy). Removing and examining a small amount of the fluid that surrounds the brain and spinal cord (lumbar puncture). How is this treated? Treatment for this condition may involve: Treating the underlying cause of the neuropathy, such as diabetes, kidney disease, or vitamin deficiencies. Stopping medicines that can cause neuropathy, such as chemotherapy. Medicine to help relieve pain. Medicines may include: Prescription or over-the-counter pain medicine. Anti-seizure medicine. Antidepressants. Pain-relieving patches that are applied to painful areas of skin. Surgery to relieve pressure on a nerve or to destroy a nerve that is causing pain. Physical therapy to help improve movement and balance. Devices to help you move around (assistive devices). Follow these instructions at home: Medicines Take over-the-counter and prescription medicines only as told by your health care provider. Do not take any other medicines without first asking your health care provider. Ask your health care provider if the medicine prescribed to you requires you to avoid driving or using machinery. Lifestyle  Do  not use any products that contain nicotine or tobacco. These  products include cigarettes, chewing tobacco, and vaping devices, such as e-cigarettes. Smoking keeps blood from reaching damaged nerves. If you need help quitting, ask your health care provider. Avoid or limit alcohol. Too much alcohol can cause a vitamin B deficiency, and vitamin B is needed for healthy nerves. Eat a healthy diet. This includes: Eating foods that are high in fiber, such as beans, whole grains, and fresh fruits and vegetables. Limiting foods that are high in fat and processed sugars, such as fried or sweet foods. General instructions  If you have diabetes, work closely with your health care provider to keep your blood sugar under control. If you have numbness in your feet: Check every day for signs of injury or infection. Watch for redness, warmth, and swelling. Wear padded socks and comfortable shoes. These help protect your feet. Develop a good support system. Living with peripheral neuropathy can be stressful. Consider talking with a mental health specialist or joining a support group. Use assistive devices and attend physical therapy as told by your health care provider. This may include using a walker or a cane. Keep all follow-up visits. This is important. Where to find more information [employer] of Neurological Disorders: [employer] Contact a health care provider if: You have new signs or symptoms of peripheral neuropathy. You are struggling emotionally from dealing with peripheral neuropathy. Your pain is not well controlled. Get help right away if: You have an injury or infection that is not healing normally. You develop new weakness in an arm or leg. You have fallen or do so frequently. Summary Peripheral neuropathy is when the nerves in the arms or legs are damaged, resulting in numbness, weakness, or pain. There are many causes of peripheral neuropathy, including diabetes, pinched nerves, vitamin deficiencies, autoimmune disease, and hereditary  conditions. Diagnosing and finding the cause of peripheral neuropathy can be difficult. Your health care provider will take your medical history, do a physical exam, and do tests, including blood tests and nerve function tests. Treatment involves treating the underlying cause of the neuropathy and taking medicines to help control pain. Physical therapy and assistive devices may also help. This information is not intended to replace advice given to you by your health care provider. Make sure you discuss any questions you have with your health care provider. Document Revised: 05/18/2021 Document Reviewed: 05/18/2021 Elsevier Patient Education  [place].

## 2022-11-14 DIAGNOSIS — R972 Elevated prostate specific antigen [PSA]: Secondary | ICD-10-CM | POA: Diagnosis not present

## 2022-11-14 DIAGNOSIS — N183 Chronic kidney disease, stage 3 unspecified: Secondary | ICD-10-CM | POA: Diagnosis not present

## 2022-11-14 DIAGNOSIS — E1142 Type 2 diabetes mellitus with diabetic polyneuropathy: Secondary | ICD-10-CM | POA: Diagnosis not present

## 2022-11-14 DIAGNOSIS — E782 Mixed hyperlipidemia: Secondary | ICD-10-CM | POA: Diagnosis not present

## 2022-11-21 DIAGNOSIS — N183 Chronic kidney disease, stage 3 unspecified: Secondary | ICD-10-CM | POA: Diagnosis not present

## 2022-11-21 DIAGNOSIS — L304 Erythema intertrigo: Secondary | ICD-10-CM | POA: Diagnosis not present

## 2022-11-21 DIAGNOSIS — I10 Essential (primary) hypertension: Secondary | ICD-10-CM | POA: Diagnosis not present

## 2022-11-21 DIAGNOSIS — E782 Mixed hyperlipidemia: Secondary | ICD-10-CM | POA: Diagnosis not present

## 2022-11-21 DIAGNOSIS — E1129 Type 2 diabetes mellitus with other diabetic kidney complication: Secondary | ICD-10-CM | POA: Diagnosis not present

## 2022-11-24 DIAGNOSIS — Z96652 Presence of left artificial knee joint: Secondary | ICD-10-CM | POA: Diagnosis not present

## 2022-11-24 DIAGNOSIS — Z471 Aftercare following joint replacement surgery: Secondary | ICD-10-CM | POA: Diagnosis not present

## 2022-11-30 ENCOUNTER — Telehealth: Payer: Self-pay

## 2022-11-30 NOTE — Patient Outreach (Signed)
  Care Coordination   Initial Visit Note   11/30/2022 Name: Alexander King MRN: [id number] DOB: 08-Jan-1946  Alexander King is a 77 y.o. year old male who sees [name], MD for primary care. I spoke with  Conan Bowens by phone today.  What matters to the patients health and wellness today?  Placed call to patient today to review and offer [facility] care coordination program. Patient reports that he is doing well and denies any needs at this time.      SDOH assessments and interventions completed:  No     Care Coordination Interventions:  No, not indicated   Follow up plan: No further intervention required.   Encounter Outcome:  Pt. Refused   Tomasa Rand, RN, BSN, CEN [facility] [employer] [phone]

## 2023-01-19 ENCOUNTER — Ambulatory Visit: Payer: Medicare PPO | Admitting: Podiatry

## 2023-01-19 DIAGNOSIS — M2011 Hallux valgus (acquired), right foot: Secondary | ICD-10-CM

## 2023-01-19 DIAGNOSIS — B351 Tinea unguium: Secondary | ICD-10-CM

## 2023-01-19 DIAGNOSIS — L84 Corns and callosities: Secondary | ICD-10-CM | POA: Diagnosis not present

## 2023-01-19 DIAGNOSIS — M2012 Hallux valgus (acquired), left foot: Secondary | ICD-10-CM | POA: Diagnosis not present

## 2023-01-19 DIAGNOSIS — M79674 Pain in right toe(s): Secondary | ICD-10-CM | POA: Diagnosis not present

## 2023-01-19 DIAGNOSIS — E1140 Type 2 diabetes mellitus with diabetic neuropathy, unspecified: Secondary | ICD-10-CM | POA: Diagnosis not present

## 2023-01-19 DIAGNOSIS — E119 Type 2 diabetes mellitus without complications: Secondary | ICD-10-CM | POA: Diagnosis not present

## 2023-01-19 DIAGNOSIS — M79675 Pain in left toe(s): Secondary | ICD-10-CM | POA: Diagnosis not present

## 2023-01-19 NOTE — Patient Instructions (Signed)
Purchase Nervive Pain Cream or Roll On. Apply to feet before bedtime. Can be purchased at your local drug store over the counter. 

## 2023-01-22 ENCOUNTER — Encounter: Payer: Self-pay | Admitting: Podiatry

## 2023-01-22 NOTE — Progress Notes (Signed)
ANNUAL DIABETIC FOOT EXAM  Subjective: Alexander King presents today for annual diabetic foot examination.  Chief Complaint  Patient presents with   diabetic foot care    Patient confirms h/o diabetes.  Patient has been diagnosed with neuropathy.  Risk factors: diabetes, HTN, hyperlipidemia, lumbar radiculopathy  Patient c/o neuropathic pain b/l.   Alexander Auer, MD is patient's PCP.  No past medical history on file. Patient Active Problem List   Diagnosis Date Noted   Solitary kidney 06/24/2020   BPH with obstruction/lower urinary tract symptoms 06/17/2020   Long-term insulin use in type 2 diabetes (HCC) 03/19/2020   Unilateral primary osteoarthritis, left knee 05/06/2019   Hyperlipidemia 06/26/2017   Hypertension 06/26/2017   Sleep apnea 06/26/2017   Malignant neoplasm of prostate (HCC) 05/02/2017   Tachycardia 06/12/2016   Acute kidney injury (nontraumatic) (HCC) 06/09/2016   Gross hematuria 06/09/2016   Urinary tract infection with hematuria 06/09/2016   Neurogenic bladder 06/08/2016   Testicular lesion 06/08/2016   Urge incontinence 06/08/2016   Continuous leakage of urine 04/05/2016   Lumbar radiculopathy 04/05/2016   Chronic pain of left knee 07/14/2015   Disc degeneration, lumbar 06/08/2015   Osteoarthritis of spine with radiculopathy, lumbar region 06/08/2015   Spondylolisthesis, lumbar region 06/08/2015   No past surgical history on file. Current Outpatient Medications on File Prior to Visit  Medication Sig Dispense Refill   amLODipine (NORVASC) 5 MG tablet Take by mouth.     aspirin 81 MG EC tablet Take by mouth.     betaxolol (KERLONE) 10 MG tablet Take by mouth.     citalopram (CELEXA) 10 MG tablet Take 10 mg by mouth daily.     Docusate Sodium 100 MG capsule Take 100 mg by mouth 2 (two) times daily.     fexofenadine (ALLEGRA) 180 MG tablet Take 180 mg by mouth daily.     fluticasone (FLONASE) 50 MCG/ACT nasal spray Place into the nose.     fosfomycin  (MONUROL) 3 g PACK Take by mouth.     HUMALOG KWIKPEN 100 UNIT/ML KwikPen      insulin glargine, 1 Unit Dial, (TOUJEO SOLOSTAR) 300 UNIT/ML Solostar Pen Inject into the skin.     ketoconazole (NIZORAL) 2 % cream APPLY TO AFFECTED AREA TWICE A DAY     lovastatin (MEVACOR) 40 MG tablet Take by mouth.     Mirabegron (MYRBETRIQ PO) Take by mouth.     NOVOFINE PLUS 32G X 4 MM MISC USE 6 TIMES DAILY AS DIRECTED     nystatin cream (MYCOSTATIN)      Omega-3 Fatty Acids (FISH OIL) 1360 MG CAPS Take by mouth.     ONETOUCH ULTRA test strip 2 (two) times daily. use for testing     pantoprazole (PROTONIX) 40 MG tablet Take by mouth.     tacrolimus (PROTOPIC) 0.1 % ointment Apply topically.     triamcinolone acetonide (TRIESENCE) 40 MG/ML SUSP Inject into the articular space.     VICTOZA 18 MG/3ML SOPN      No current facility-administered medications on file prior to visit.    Allergies  Allergen Reactions   Sulfamethoxazole-Trimethoprim Hives, Itching and Rash    HIVES AND ITCHING HIVES AND ITCHING    Nitrofurantoin Nausea And Vomiting   Social History   Occupational History   Not on file  Tobacco Use   Smoking status: Never   Smokeless tobacco: Current    Types: Chew  Substance and Sexual Activity   Alcohol use: Not Currently  Drug use: Never   Sexual activity: Not Currently   No family history on file.  There is no immunization history on file for this patient.   Review of Systems: Negative except as noted in the HPI.   Objective: There were no vitals filed for this visit.  Alexander King is a pleasant 77 y.o. male in NAD. AAO X 3.  Vascular Examination: CFT <3 seconds b/l LE. Palpable DP pulse(s) b/l LE. Palpable PT pulse(s) b/l LE. Pedal hair sparse. No pain with calf compression b/l. Lower extremity skin temperature gradient within normal limits. Nonpitting edema noted bilateral ankles.  Dermatological Examination: Pedal skin is warm and supple b/l LE. Toenails 1-5 b/l  elongated, discolored, dystrophic, thickened, crumbly with subungual debris and tenderness to dorsal palpation. Incurvated nailplate bilateral great toes.  Nail border hypertrophy absent. There is tenderness to palpation. Sign(s) of infection: no clinical signs of infection noted on examination today..  Neurological Examination: Pt has subjective symptoms of neuropathy. Protective sensation diminished with 10g monofilament b/l. Vibratory sensation intact b/l. Proprioception intact bilaterally.  Musculoskeletal Examination: Muscle strength 5/5 to all lower extremity muscle groups bilaterally. No gross bony deformities bilaterally.  ADA Risk Categorization: High Risk  Patient has one or more of the following: Loss of protective sensation Absent pedal pulses Severe Foot deformity History of foot ulcer  Assessment: 1. Pain due to onychomycosis of toenails of both feet   2. Callus   3. Type 2 diabetes mellitus with diabetic neuropathy, without long-term current use of insulin (HCC)   4. Hallux valgus, acquired, bilateral   5. Encounter for diabetic foot exam [facility])     Plan: -Patient was evaluated and treated. All patient's and/or POA's questions/concerns answered on today's visit. -Discussed neuropathy symptoms. Advised patient/POA to purchase Nervive Pain Cream or Roll On. Apply to foot/feet before bedtime. -Diabetic foot examination performed today. -Patient to continue soft, supportive shoe gear daily. -Mycotic toenails 1-5 bilaterally were debrided in length and girth with sterile nail nippers and dremel without incident. -Callus(es) submet head 5 left foot pared utilizing sterile scalpel blade without complication or incident. Total number debrided =1. -Patient/POA to call should there be question/concern in the interim. Return in about 3 months (around 04/20/2023).  Freddie Breech, DPM

## 2023-02-15 DIAGNOSIS — E782 Mixed hyperlipidemia: Secondary | ICD-10-CM | POA: Diagnosis not present

## 2023-02-15 DIAGNOSIS — E1129 Type 2 diabetes mellitus with other diabetic kidney complication: Secondary | ICD-10-CM | POA: Diagnosis not present

## 2023-02-15 DIAGNOSIS — Z79899 Other long term (current) drug therapy: Secondary | ICD-10-CM | POA: Diagnosis not present

## 2023-02-15 DIAGNOSIS — C61 Malignant neoplasm of prostate: Secondary | ICD-10-CM | POA: Diagnosis not present

## 2023-02-21 DIAGNOSIS — Z1331 Encounter for screening for depression: Secondary | ICD-10-CM | POA: Diagnosis not present

## 2023-02-21 DIAGNOSIS — I10 Essential (primary) hypertension: Secondary | ICD-10-CM | POA: Diagnosis not present

## 2023-02-21 DIAGNOSIS — E782 Mixed hyperlipidemia: Secondary | ICD-10-CM | POA: Diagnosis not present

## 2023-02-21 DIAGNOSIS — Z6841 Body Mass Index (BMI) 40.0 and over, adult: Secondary | ICD-10-CM | POA: Diagnosis not present

## 2023-02-21 DIAGNOSIS — Z0000 Encounter for general adult medical examination without abnormal findings: Secondary | ICD-10-CM | POA: Diagnosis not present

## 2023-02-21 DIAGNOSIS — Z1339 Encounter for screening examination for other mental health and behavioral disorders: Secondary | ICD-10-CM | POA: Diagnosis not present

## 2023-02-21 DIAGNOSIS — E1142 Type 2 diabetes mellitus with diabetic polyneuropathy: Secondary | ICD-10-CM | POA: Diagnosis not present

## 2023-02-21 DIAGNOSIS — E1165 Type 2 diabetes mellitus with hyperglycemia: Secondary | ICD-10-CM | POA: Diagnosis not present

## 2023-02-21 DIAGNOSIS — Z9181 History of falling: Secondary | ICD-10-CM | POA: Diagnosis not present

## 2023-04-17 DIAGNOSIS — N390 Urinary tract infection, site not specified: Secondary | ICD-10-CM | POA: Diagnosis not present

## 2023-04-19 DIAGNOSIS — H938X2 Other specified disorders of left ear: Secondary | ICD-10-CM | POA: Diagnosis not present

## 2023-04-19 DIAGNOSIS — Z6841 Body Mass Index (BMI) 40.0 and over, adult: Secondary | ICD-10-CM | POA: Diagnosis not present

## 2023-04-19 DIAGNOSIS — R300 Dysuria: Secondary | ICD-10-CM | POA: Diagnosis not present

## 2023-04-20 ENCOUNTER — Ambulatory Visit: Payer: Medicare PPO | Admitting: Podiatry

## 2023-04-27 ENCOUNTER — Encounter: Payer: Self-pay | Admitting: Urology

## 2023-04-27 ENCOUNTER — Ambulatory Visit: Payer: Medicare PPO | Admitting: Urology

## 2023-04-27 VITALS — BP 125/59 | HR 69 | Ht 67.0 in | Wt 248.0 lb

## 2023-04-27 DIAGNOSIS — N401 Enlarged prostate with lower urinary tract symptoms: Secondary | ICD-10-CM | POA: Diagnosis not present

## 2023-04-27 DIAGNOSIS — R8290 Unspecified abnormal findings in urine: Secondary | ICD-10-CM

## 2023-04-27 DIAGNOSIS — C61 Malignant neoplasm of prostate: Secondary | ICD-10-CM | POA: Diagnosis not present

## 2023-04-27 DIAGNOSIS — N138 Other obstructive and reflux uropathy: Secondary | ICD-10-CM

## 2023-04-27 DIAGNOSIS — Z87440 Personal history of urinary (tract) infections: Secondary | ICD-10-CM | POA: Diagnosis not present

## 2023-04-27 DIAGNOSIS — N319 Neuromuscular dysfunction of bladder, unspecified: Secondary | ICD-10-CM | POA: Diagnosis not present

## 2023-04-27 LAB — URINALYSIS, ROUTINE W REFLEX MICROSCOPIC
Bilirubin, UA: NEGATIVE
Glucose, UA: NEGATIVE
Ketones, UA: NEGATIVE
Leukocytes,UA: NEGATIVE
Nitrite, UA: NEGATIVE
Specific Gravity, UA: 1.03 (ref 1.005–1.030)
Urobilinogen, Ur: 0.2 mg/dL (ref 0.2–1.0)
pH, UA: 5.5 (ref 5.0–7.5)

## 2023-04-27 LAB — MICROSCOPIC EXAMINATION

## 2023-04-27 NOTE — Progress Notes (Signed)
Assessment: 1. BPH with obstruction/lower urinary tract symptoms   2. Prostate cancer (HCC); Gleason 6; T1c: on active surveillance   3. History of UTI      Plan: I personally reviewed the patient's chart including provider notes, and lab results. Urine culture sent today. Continue Myrbetriq 25 mg daily. PSA today. Return to office in 6 weeks   Chief Complaint:  Chief Complaint  Patient presents with   Benign Prostatic Hypertrophy    History of Present Illness:  Alexander King is a 77 y.o. male who is seen for evaluation of prostate cancer, BPH with obstruction, neurogenic bladder, urge incontinence, and history of UTIs. He was previously followed at [facility] health urology in [facility].  He was last seen by me in June 2022. He was seen today to establish care here. He reports improvement in his lower urinary tract symptoms.  He is no longer taking tamsulosin.  He is no longer performing intermittent catheterization.  He continues on Myrbetriq 25 mg daily.  He feels like he empties his bladder well.  He does have some frequency and urgency.  He has not had any significant UTIs for the past 2 years.  He recently noted some dysuria 2 weeks ago.  He has recently taken a dose of fosfomycin and fluconazole.  No gross hematuria or flank pain.  No recent PSA levels. IPSS = 4 today  Urologic History: He presented with urinary incontinence. He had onset of incontinence without sensory awareness following lumbar surgery in April 2017. He underwent a L4-5 fusion. He had frequency, urgency, and incontinence since that time. No symptoms prior to surgery. He was using 4 pullups/day. He tried tamsulosin and Myrbetriq without benefit. He has been evaluated with MRI and nerve conduction studies. No fecal incontinence. He was given a trial of Vesicare but discontinued due to dizziness. He was given samples of Toviaz and noted some slight improvement in his frequency and urgency but continued to have  incontinence.  PVR at prior visit was 25 mL. Cystoscopy from August 2017 showed lateral lobe enlargement with a median lobe, minimal trabeculations within the bladder. Urodynamics showed a small capacity noncompliant bladder with associated incontinence. He was started on clean intermittent catheterization after his visit on 06/07/16.  He continues to have significant lower urinary tract symptoms including frequency, urgency, nocturia x4, sensation of incomplete emptying, hesitancy, and dribbling. He also has occasional incontinence. He has been taking tamsulosin and Vesicare. No dysuria or gross hematuria. AUA score = 14. Urodynamics from 03/12/2020 showed decreased capacity, instability without leakage, high pressure voiding with obstructed flow pattern, increased EMG activity during voiding, with post void residual of 89 mL. He continued with lower urinary tract symptoms. He has urgency, frequency, intermittent stream, hesitancy, decreased force of stream, and nocturia. He does not feel like he empties his bladder completely. He was not having any dysuria or gross hematuria.  AUA score = 20. Cystoscopy from 06/02/2020 showed lateral lobe enlargement with a median lobe and bladder trabeculations.  Urine culture from 12/18 grew Klebsiella. Treated with Cipro.  He continued on daily TMP. Urine culture from 1/19 grew Klebsiella. Treated with Augmentin. He was started on ampicillin daily. Urine culture from 2/19 grew Klebsiella.  He discontinued the daily ampicillin.  He was diagnosed with a symptomatic UTI in July 2020. Urine culture grew 25-50 K staph epidermidis. He was diagnosed with another symptomatic UTI on 06/12/2019. Urine culture grew >100 K Klebsiella. He was treated with Cipro.  He reports being  treated for UTIs x2 in October. He was started on daily Keflex in late October 2020.  He was treated for a UTI in December 2020.  Urine culture from 3/21 grew >100 K Klebsiella. He was treated with  Cipro. Urine culture from 6/21 grew >100K MRSA. Treated with Macrobid.  He has a history of UTI symptoms. A urinalysis from 07/02/2020 showed blood on dipstick. No urine culture was obtained. He was treated with Cipro x7 days. He reports return of his symptoms shortly after completing the Cipro. A urinalysis from 07/13/2020 showed 1+ leukocyte esterase and positive blood.  He was scheduled for a TURP on 07/16/2020. He continued to perform intermittent catheterization. He reported worsening of his urinary symptoms including increased frequency, urgency, and nocturia.  He has been evaluated by Dr. Trisha Mangle in 9/21 and was started on methenamine for UTI prevention. Urine culture from 2/22: >100 K Enterococcus. Treated with Cipro Urine culture from 2/22: No growth Urine culture from 3/22: No growth  He underwent a renal ultrasound in May 2017 which showed left renal pelvic caliectasis and a moderately distended bladder. He also had a scrotal ultrasound in May 2017 which showed asymmetric heterogeneous appearance of the left testicle as compared to the right without obvious testicular mass. A repeat ultrasound in 3 months was recommended to evaluate for stability. Scrotal ultrasound from 06/21/16 showed a normal right testicle without mass or microlithiasis, heterogeneous echotexture to the left testicle without obvious mass lesion, bilateral epididymal cyst, and small bilateral hydroceles. Scrotal U/S from 3/18 showed heterogeneity of the left testicle, decreased from prior exam, without obvious mass, bilateral epididymal cysts, and small bilateral hydroceles.   Cr from 2/18 was 2.49. Cr from 4/18 was 2.85. Cr from 9/18 was 2.16. Cr from 5/19 is 1.9. Cr from 9/19 was 1.7. Cr from 1/20 was 1.8. Cr from 5/20 was 1.7.  Creatinine from 1/21 was 1.7. Creatinine from 5/21: 1.6  PSA from 2/18 5.0. PSA from 4/18 was 13.2 (drawn at time of UTI). 4K score from 03/24/17 showed a PSA of 4.18, free PSA of 20%, and 14%  probability of aggressive prostate cancer on biopsy. Rectal culture showed no FQ resistant organisms.  He underwent transrectal ultrasound and biopsy of the prostate on 04/20/17. PSA: 4.18 ng/ml DRE: normal TRUS volume: 54.72 ml Biopsy results:  Gleason score: 3+ 3 = 6 # positive cores: 1/6 on right 0/6 on left Location of cancer: base  He elected to proceed with active surveillance for management of his localized prostate cancer.  PSA from 06/19/17: 3.37 PSA from 10/04/17: 7.51 (Drawn at time of UTI) PSA from 11/15/17: 5.23 PSA from 01/31/18: 3.10 PSA from 06/14/18: 1.68 Prostate MRI from 07/12/2018 showed no evidence of macroscopic or high-grade carcinoma, multiple areas of ill-defined hypointensity in the central gland likely related to BPH, PI-RADS 2-3. PSA from 10/18/18: 2.83 PSA from 5/20: 1.98 PSA from 9/20: 2.16 PSA from 1/21: 2.82 PSA from 5/21: 2.39 PSA from 1/22: 3.11 PSA from 8/22: 3.15  CT urogram from 12/21 showed a indeterminate density lesion in the upper pole of the left kidney approximately 13 mm in size. No kidney stones or obstruction noted. Further evaluation with a MRI showed a hemorrhagic cyst involving the left kidney.  He reports being treated for a UTI with a urine culture from 01/03/2021 showing 1000 colonies of Enterococcus. He was treated with Macrobid. He reported developing some fever and chills. He was changed to Levaquin. His fevers esolved. He was previously having frequency and  incontinence. He is no longer incontinent. He has been unable to void spontaneously. He is catheterizing 5-6 times per day with unknown volumes. He continued on Flomax, Vesicare, and recently started taking trospium. AUA score = 20.  He underwent a TURP on 02/01/2021. Pathology showed benign prostate tissue. He was discharged with the Foley catheter in place which he removed several days later. He then resumed intermittent catheterization.  At his postop visit on 02/17/2021, he was  voiding spontaneously with a good stream. No dysuria or gross hematuria. He had noted some cloudy urine. He had not catheterized in over a week. He continued on Flomax. He noted some swelling and pain of the right scrotum. Urine culture grew 50-100 K MRSA. He was also diagnosed with epididymitis. He was treated with doxycycline.  At his visit on 03/04/21, he reported worsening problems with difficulty voiding. He was having nearly continuous leakage of urine. He attempted intermittent catheterization but was unable to pass the catheter and had only return of small clots. He continued to have swelling and discomfort in the right scrotum. He continued on doxycycline. Cystoscopy demonstrated a normal urethra, widely patent prostatic urethra s/p TURP, diffuse erythema of bladder with mucosal edema. He continued to have right scrotal swelling and pain. He was treated with fosfomycin daily x 3 days and fluconazole. A foley was placed. He removed the foley and was been voiding spontaneously. No gross hematuria. He continued to have dysuria. He was admitted to [facility] for fever and MS changes. Treated with IV antibiotics for UTI. No records available. He continued on Cefuroxime.   Portions of the above documentation were copied from a prior visit for review purposes only.   Past Medical History:  No past medical history on file.  Past Surgical History:  No past surgical history on file.  Allergies:  Allergies  Allergen Reactions   Sulfamethoxazole-Trimethoprim Hives, Itching and Rash    HIVES AND ITCHING HIVES AND ITCHING    Nitrofurantoin Nausea And Vomiting    Other Reaction(s): GI Intolerance    Family History:  No family history on file.  Social History:  Social History   Tobacco Use   Smoking status: Never   Smokeless tobacco: Current    Types: Chew  Substance Use Topics   Alcohol use: Not Currently   Drug use: Never    Review of symptoms:  Constitutional:  Negative for  unexplained weight loss, night sweats, fever, chills ENT:  Negative for nose bleeds, sinus pain, painful swallowing CV:  Negative for chest pain, shortness of breath, exercise intolerance, palpitations, loss of consciousness Resp:  Negative for cough, wheezing, shortness of breath GI:  Negative for nausea, vomiting, diarrhea, bloody stools GU:  Positives noted in HPI; otherwise negative for gross hematuria Neuro:  Negative for seizures, poor balance, limb weakness, slurred speech Psych:  Negative for lack of energy, depression, anxiety Endocrine:  Negative for polydipsia, polyuria, symptoms of hypoglycemia (dizziness, hunger, sweating) Hematologic:  Negative for anemia, purpura, petechia, prolonged or excessive bleeding, use of anticoagulants  Allergic:  Negative for difficulty breathing or choking as a result of exposure to anything; no shellfish allergy; no allergic response (rash/itch) to materials, foods  Physical exam: BP (!) 125/59   Pulse 69   Ht 5\' 7"  (1.702 m)   Wt 248 lb (112.5 kg)   BMI 38.84 kg/m  GENERAL APPEARANCE:  Well appearing, well developed, well nourished, NAD HEENT: Atraumatic, Normocephalic, oropharynx clear. NECK: Supple without lymphadenopathy or thyromegaly. LUNGS: Clear to auscultation  bilaterally. HEART: Regular Rate and Rhythm without murmurs, gallops, or rubs. ABDOMEN: Soft, non-tender, No Masses. EXTREMITIES: Moves all extremities well.  Without clubbing, cyanosis, or edema. NEUROLOGIC:  Alert and oriented x 3, normal gait, CN II-XII grossly intact.  MENTAL STATUS:  Appropriate. BACK:  Non-tender to palpation.  No CVAT SKIN:  Warm, dry and intact.   GU: Penis:  circumcised Meatus: Normal Scrotum: normal, no masses Testis: normal without masses bilateral Epididymis: normal Prostate: 50 g, NT, no nodules Rectum: Normal tone,  no masses or tenderness   Results: U/A:  11-30 RBC, 0-5 WBC, few bacteria

## 2023-05-15 ENCOUNTER — Ambulatory Visit (INDEPENDENT_AMBULATORY_CARE_PROVIDER_SITE_OTHER): Payer: Medicare PPO | Admitting: Podiatry

## 2023-05-15 DIAGNOSIS — B351 Tinea unguium: Secondary | ICD-10-CM

## 2023-05-15 DIAGNOSIS — L84 Corns and callosities: Secondary | ICD-10-CM | POA: Diagnosis not present

## 2023-05-15 DIAGNOSIS — E1140 Type 2 diabetes mellitus with diabetic neuropathy, unspecified: Secondary | ICD-10-CM | POA: Diagnosis not present

## 2023-05-15 DIAGNOSIS — M79675 Pain in left toe(s): Secondary | ICD-10-CM | POA: Diagnosis not present

## 2023-05-15 DIAGNOSIS — M79674 Pain in right toe(s): Secondary | ICD-10-CM

## 2023-05-15 NOTE — Progress Notes (Signed)
  Subjective:  Patient ID: Alexander King, male    DOB: 1945/12/06,  MRN: [id number]  Chief Complaint  Patient presents with   Nail Problem    DFC    77 y.o. male presents with the above complaint. History confirmed with patient. Patient presenting with pain related to dystrophic thickened elongated nails. Patient is unable to trim own nails related to nail dystrophy and/or mobility issues. Patient does have a history of T2DM. Patient does have callus present located at the plantar aspect of the left 5th met head.   Objective:  Physical Exam: warm, good capillary refill nail exam onychomycosis of the toenails, onycholysis, and dystrophic nails DP pulses palpable, PT pulses palpable, and protective sensation absent Left Foot:  Pain with palpation of nails due to elongation and dystrophic growth. Callus present sub 5th metatarsal head.  Right Foot: Pain with palpation of nails due to elongation and dystrophic growth.   Assessment:   1. Pain due to onychomycosis of toenails of both feet   2. Callus   3. Type 2 diabetes mellitus with diabetic neuropathy, without long-term current use of insulin (HCC)      Plan:  Patient was evaluated and treated and all questions answered.  #Hyperkeratotic lesions/pre ulcerative calluses present sub 5th met head left foot All symptomatic hyperkeratoses x 1 separate lesions were safely debrided with a sterile #10 blade to patient's level of comfort without incident. We discussed preventative and palliative care of these lesions including supportive and accommodative shoegear, padding, prefabricated and custom molded accommodative orthoses, use of a pumice stone and lotions/creams daily.  #Onychomycosis with pain  -Nails palliatively debrided as below. -Educated on self-care  Procedure: Nail Debridement Rationale: Pain Type of Debridement: manual, sharp debridement. Instrumentation: Nail nipper, rotary burr. Number of Nails: 10  Return in about 3  months (around 08/15/2023) for [facility].         Corinna Gab, DPM [facility] / [facility]

## 2023-05-18 DIAGNOSIS — C61 Malignant neoplasm of prostate: Secondary | ICD-10-CM | POA: Diagnosis not present

## 2023-05-18 DIAGNOSIS — E1122 Type 2 diabetes mellitus with diabetic chronic kidney disease: Secondary | ICD-10-CM | POA: Diagnosis not present

## 2023-05-25 DIAGNOSIS — Z6839 Body mass index (BMI) 39.0-39.9, adult: Secondary | ICD-10-CM | POA: Diagnosis not present

## 2023-05-25 DIAGNOSIS — E1165 Type 2 diabetes mellitus with hyperglycemia: Secondary | ICD-10-CM | POA: Diagnosis not present

## 2023-05-25 DIAGNOSIS — I10 Essential (primary) hypertension: Secondary | ICD-10-CM | POA: Diagnosis not present

## 2023-05-25 DIAGNOSIS — E782 Mixed hyperlipidemia: Secondary | ICD-10-CM | POA: Diagnosis not present

## 2023-05-25 DIAGNOSIS — N390 Urinary tract infection, site not specified: Secondary | ICD-10-CM | POA: Diagnosis not present

## 2023-05-25 DIAGNOSIS — N312 Flaccid neuropathic bladder, not elsewhere classified: Secondary | ICD-10-CM | POA: Diagnosis not present

## 2023-06-05 DIAGNOSIS — H35373 Puckering of macula, bilateral: Secondary | ICD-10-CM | POA: Diagnosis not present

## 2023-06-05 DIAGNOSIS — H524 Presbyopia: Secondary | ICD-10-CM | POA: Diagnosis not present

## 2023-06-05 DIAGNOSIS — E119 Type 2 diabetes mellitus without complications: Secondary | ICD-10-CM | POA: Diagnosis not present

## 2023-06-08 ENCOUNTER — Ambulatory Visit: Payer: Medicare PPO | Admitting: Urology

## 2023-06-15 DIAGNOSIS — M160 Bilateral primary osteoarthritis of hip: Secondary | ICD-10-CM | POA: Diagnosis not present

## 2023-06-15 DIAGNOSIS — M25552 Pain in left hip: Secondary | ICD-10-CM | POA: Diagnosis not present

## 2023-06-15 DIAGNOSIS — G8929 Other chronic pain: Secondary | ICD-10-CM | POA: Diagnosis not present

## 2023-06-27 DIAGNOSIS — Z23 Encounter for immunization: Secondary | ICD-10-CM | POA: Diagnosis not present

## 2023-08-07 DIAGNOSIS — N390 Urinary tract infection, site not specified: Secondary | ICD-10-CM | POA: Diagnosis not present

## 2023-08-15 ENCOUNTER — Ambulatory Visit: Payer: Medicare PPO | Admitting: Podiatry

## 2023-08-15 DIAGNOSIS — L84 Corns and callosities: Secondary | ICD-10-CM | POA: Diagnosis not present

## 2023-08-15 DIAGNOSIS — M79674 Pain in right toe(s): Secondary | ICD-10-CM | POA: Diagnosis not present

## 2023-08-15 DIAGNOSIS — B351 Tinea unguium: Secondary | ICD-10-CM

## 2023-08-15 DIAGNOSIS — E1140 Type 2 diabetes mellitus with diabetic neuropathy, unspecified: Secondary | ICD-10-CM

## 2023-08-15 DIAGNOSIS — M79675 Pain in left toe(s): Secondary | ICD-10-CM

## 2023-08-15 NOTE — Progress Notes (Signed)
  Subjective:  Patient ID: Alexander King, male    DOB: 1946/01/24,  MRN: [id number]  Chief Complaint  Patient presents with   Foot Care    Last A1c: 6.7. Takes ASA 81 mg. No other concerns.     77 y.o. male presents with the above complaint. History confirmed with patient. Patient presenting with pain related to dystrophic thickened elongated nails. Patient is unable to trim own nails related to nail dystrophy and/or mobility issues. Patient does have a history of T2DM. Patient does have callus present located at the plantar aspect of the left 5th met head.   Objective:  Physical Exam: warm, good capillary refill nail exam onychomycosis of the toenails, onycholysis, and dystrophic nails DP pulses palpable, PT pulses palpable, and protective sensation absent Left Foot:  Pain with palpation of nails due to elongation and dystrophic growth. Callus present sub 5th metatarsal head.  Right Foot: Pain with palpation of nails due to elongation and dystrophic growth.   Assessment:   1. Pre-ulcerative calluses   2. Pain due to onychomycosis of toenails of both feet   3. Type 2 diabetes mellitus with diabetic neuropathy, without long-term current use of insulin (HCC)       Plan:  Patient was evaluated and treated and all questions answered.  #Hyperkeratotic lesions/pre ulcerative calluses present sub 5th met head left foot All symptomatic hyperkeratoses x 1 separate lesions were safely debrided with a sterile #10 blade to patient's level of comfort without incident. We discussed preventative and palliative care of these lesions including supportive and accommodative shoegear, padding, prefabricated and custom molded accommodative orthoses, use of a pumice stone and lotions/creams daily.  #Onychomycosis with pain  -Nails palliatively debrided as below. -Educated on self-care  Procedure: Nail Debridement Rationale: Pain Type of Debridement: manual, sharp debridement. Instrumentation: Nail  nipper, rotary burr. Number of Nails: 10  Return in about 3 months (around 11/15/2023) for [facility].         Corinna Gab, DPM [facility] / [facility]

## 2023-08-18 ENCOUNTER — Encounter: Payer: Self-pay | Admitting: Urology

## 2023-08-18 ENCOUNTER — Ambulatory Visit: Payer: Medicare PPO | Admitting: Urology

## 2023-08-18 VITALS — BP 141/81 | HR 77 | Ht 67.0 in | Wt 226.0 lb

## 2023-08-18 DIAGNOSIS — N138 Other obstructive and reflux uropathy: Secondary | ICD-10-CM

## 2023-08-18 DIAGNOSIS — Z8546 Personal history of malignant neoplasm of prostate: Secondary | ICD-10-CM | POA: Diagnosis not present

## 2023-08-18 DIAGNOSIS — Z87440 Personal history of urinary (tract) infections: Secondary | ICD-10-CM

## 2023-08-18 DIAGNOSIS — R300 Dysuria: Secondary | ICD-10-CM

## 2023-08-18 DIAGNOSIS — N401 Enlarged prostate with lower urinary tract symptoms: Secondary | ICD-10-CM | POA: Diagnosis not present

## 2023-08-18 DIAGNOSIS — B356 Tinea cruris: Secondary | ICD-10-CM | POA: Diagnosis not present

## 2023-08-18 DIAGNOSIS — C61 Malignant neoplasm of prostate: Secondary | ICD-10-CM | POA: Diagnosis not present

## 2023-08-18 LAB — URINALYSIS, ROUTINE W REFLEX MICROSCOPIC
Bilirubin, UA: NEGATIVE
Glucose, UA: NEGATIVE
Nitrite, UA: NEGATIVE
Specific Gravity, UA: 1.025 (ref 1.005–1.030)
Urobilinogen, Ur: 0.2 mg/dL (ref 0.2–1.0)
pH, UA: 7 (ref 5.0–7.5)

## 2023-08-18 LAB — MICROSCOPIC EXAMINATION: RBC, Urine: >30 /[HPF] — AB (ref 0–2)

## 2023-08-18 MED ORDER — CIPROFLOXACIN HCL 500 MG PO TABS: 500.0000 mg | ORAL_TABLET | Freq: Two times a day (BID) | ORAL | 0 refills | Status: AC

## 2023-08-18 MED ORDER — FLUCONAZOLE 100 MG PO TABS: 100.0000 mg | ORAL_TABLET | Freq: Every day | ORAL | 0 refills | Status: DC

## 2023-08-18 NOTE — Progress Notes (Signed)
Assessment: 1. Dysuria   2. BPH with obstruction/lower urinary tract symptoms   3. Prostate cancer (HCC); Gleason 6; T1c: on active surveillance since 7/18   4. History of UTI   5. Tinea cruris      Plan: Resolve Mdx culture sent today Begin Cipro 500 mg BID x 5 days.  Rx sent. Fluconazole 100 mg daily x 7 days Recommend prostate MRI for continued evaluation of low risk prostate cancer on active surveillance Return to office in 1 month   Chief Complaint:  Chief Complaint  Patient presents with   Benign Prostatic Hypertrophy    History of Present Illness:  Alexander King is a 77 y.o. male who is seen for evaluation of prostate cancer, BPH with obstruction, neurogenic bladder, urge incontinence, and history of UTIs. He was previously followed at [facility] in [facility].  He was last seen by me in June 2022.  At his visit in 8/24, he reported improvement in his lower urinary tract symptoms.  He was no longer taking tamsulosin and was no longer performing intermittent catheterization.  He continued on Myrbetriq 25 mg daily.  He felt like he emptied his bladder well.  He continued with some frequency and urgency.  He had not had any significant UTI's for the past 2 years.  He noted some dysuria  about 2 weeks prior.  He had recently taken a dose of fosfomycin and fluconazole.  No gross hematuria or flank pain.   IPSS = 4 Urine culture: <10K colonies PSA 2.8  He presents today for evaluation of dysuria.  He noted onset of dysuria approximately 3 weeks ago.  His urinary symptoms were very stable up until that point.  He has had increased frequency, urgency, and some urge incontinence in association with the dysuria.  No gross hematuria.  He was evaluated with a urinalysis on 08/07/2023 which showed 2+ leukocytes, and positive blood on dipstick.  Urine culture grew 25-50 K mixed flora.  He was not treated with any antibiotics.  He has recently taken fosfomycin. He is no longer  taking Myrbetriq. IPSS = 21. He also reports a erythematous rash in both groin areas with some discomfort.  He has been using topical nystatin cream and powder without benefit.  Urologic History: He presented with urinary incontinence. He had onset of incontinence without sensory awareness following lumbar surgery in April 2017. He underwent a L4-5 fusion. He had frequency, urgency, and incontinence since that time. No symptoms prior to surgery. He was using 4 pullups/day. He tried tamsulosin and Myrbetriq without benefit. He has been evaluated with MRI and nerve conduction studies. No fecal incontinence. He was given a trial of Vesicare but discontinued due to dizziness. He was given samples of Toviaz and noted some slight improvement in his frequency and urgency but continued to have incontinence.  PVR at prior visit was 25 mL. Cystoscopy from August 2017 showed lateral lobe enlargement with a median lobe, minimal trabeculations within the bladder. Urodynamics showed a small capacity noncompliant bladder with associated incontinence. He was started on clean intermittent catheterization after his visit on 06/07/16.  He continues to have significant lower urinary tract symptoms including frequency, urgency, nocturia x4, sensation of incomplete emptying, hesitancy, and dribbling. He also has occasional incontinence. He has been taking tamsulosin and Vesicare. No dysuria or gross hematuria. AUA score = 14. Urodynamics from 03/12/2020 showed decreased capacity, instability without leakage, high pressure voiding with obstructed flow pattern, increased EMG activity during voiding, with post  void residual of 89 mL. He continued with lower urinary tract symptoms. He has urgency, frequency, intermittent stream, hesitancy, decreased force of stream, and nocturia. He does not feel like he empties his bladder completely. He was not having any dysuria or gross hematuria.  AUA score = 20. Cystoscopy from 06/02/2020  showed lateral lobe enlargement with a median lobe and bladder trabeculations.  Urine culture from 12/18 grew Klebsiella. Treated with Cipro.  He continued on daily TMP. Urine culture from 1/19 grew Klebsiella. Treated with Augmentin. He was started on ampicillin daily. Urine culture from 2/19 grew Klebsiella.  He discontinued the daily ampicillin.  He was diagnosed with a symptomatic UTI in July 2020. Urine culture grew 25-50 K staph epidermidis. He was diagnosed with another symptomatic UTI on 06/12/2019. Urine culture grew >100 K Klebsiella. He was treated with Cipro.  He reports being treated for UTIs x2 in October. He was started on daily Keflex in late October 2020.  He was treated for a UTI in December 2020.  Urine culture from 3/21 grew >100 K Klebsiella. He was treated with Cipro. Urine culture from 6/21 grew >100K MRSA. Treated with Macrobid.  He has a history of UTI symptoms. A urinalysis from 07/02/2020 showed blood on dipstick. No urine culture was obtained. He was treated with Cipro x7 days. He reports return of his symptoms shortly after completing the Cipro. A urinalysis from 07/13/2020 showed 1+ leukocyte esterase and positive blood.  He was scheduled for a TURP on 07/16/2020. He continued to perform intermittent catheterization. He reported worsening of his urinary symptoms including increased frequency, urgency, and nocturia.  He has been evaluated by Dr. Trisha Mangle in 9/21 and was started on methenamine for UTI prevention. Urine culture from 2/22: >100 K Enterococcus. Treated with Cipro Urine culture from 2/22: No growth Urine culture from 3/22: No growth  He underwent a renal ultrasound in May 2017 which showed left renal pelvic caliectasis and a moderately distended bladder. He also had a scrotal ultrasound in May 2017 which showed asymmetric heterogeneous appearance of the left testicle as compared to the right without obvious testicular mass. A repeat ultrasound in 3 months  was recommended to evaluate for stability. Scrotal ultrasound from 06/21/16 showed a normal right testicle without mass or microlithiasis, heterogeneous echotexture to the left testicle without obvious mass lesion, bilateral epididymal cyst, and small bilateral hydroceles. Scrotal U/S from 3/18 showed heterogeneity of the left testicle, decreased from prior exam, without obvious mass, bilateral epididymal cysts, and small bilateral hydroceles.   Cr from 2/18 was 2.49. Cr from 4/18 was 2.85. Cr from 9/18 was 2.16. Cr from 5/19 is 1.9. Cr from 9/19 was 1.7. Cr from 1/20 was 1.8. Cr from 5/20 was 1.7.  Creatinine from 1/21 was 1.7. Creatinine from 5/21: 1.6  PSA from 2/18 5.0. PSA from 4/18 was 13.2 (drawn at time of UTI). 4K score from 03/24/17 showed a PSA of 4.18, free PSA of 20%, and 14% probability of aggressive prostate cancer on biopsy. Rectal culture showed no FQ resistant organisms.  He underwent transrectal ultrasound and biopsy of the prostate on 04/20/17. PSA: 4.18 ng/ml DRE: normal TRUS volume: 54.72 ml Biopsy results:  Gleason score: 3+ 3 = 6 # positive cores: 1/6 on right 0/6 on left Location of cancer: base  He elected to proceed with active surveillance for management of his localized prostate cancer.  PSA from 06/19/17: 3.37 PSA from 10/04/17: 7.51 (Drawn at time of UTI) PSA from 11/15/17: 5.23 PSA from  01/31/18: 3.10 PSA from 06/14/18: 1.68 Prostate MRI from 07/12/2018 showed no evidence of macroscopic or high-grade carcinoma, multiple areas of ill-defined hypointensity in the central gland likely related to BPH, PI-RADS 2-3. PSA from 10/18/18: 2.83 PSA from 5/20: 1.98 PSA from 9/20: 2.16 PSA from 1/21: 2.82 PSA from 5/21: 2.39 PSA from 1/22: 3.11 PSA from 8/22: 3.15  CT urogram from 12/21 showed a indeterminate density lesion in the upper pole of the left kidney approximately 13 mm in size. No kidney stones or obstruction noted. Further evaluation with a MRI showed a  hemorrhagic cyst involving the left kidney.  He reports being treated for a UTI with a urine culture from 01/03/2021 showing 1000 colonies of Enterococcus. He was treated with Macrobid. He reported developing some fever and chills. He was changed to Levaquin. His fevers esolved. He was previously having frequency and incontinence. He is no longer incontinent. He has been unable to void spontaneously. He is catheterizing 5-6 times per day with unknown volumes. He continued on Flomax, Vesicare, and recently started taking trospium. AUA score = 20.  He underwent a TURP on 02/01/2021. Pathology showed benign prostate tissue. He was discharged with the Foley catheter in place which he removed several days later. He then resumed intermittent catheterization.  At his postop visit on 02/17/2021, he was voiding spontaneously with a good stream. No dysuria or gross hematuria. He had noted some cloudy urine. He had not catheterized in over a week. He continued on Flomax. He noted some swelling and pain of the right scrotum. Urine culture grew 50-100 K MRSA. He was also diagnosed with epididymitis. He was treated with doxycycline.  At his visit on 03/04/21, he reported worsening problems with difficulty voiding. He was having nearly continuous leakage of urine. He attempted intermittent catheterization but was unable to pass the catheter and had only return of small clots. He continued to have swelling and discomfort in the right scrotum. He continued on doxycycline. Cystoscopy demonstrated a normal urethra, widely patent prostatic urethra s/p TURP, diffuse erythema of bladder with mucosal edema. He continued to have right scrotal swelling and pain. He was treated with fosfomycin daily x 3 days and fluconazole. A foley was placed. He removed the foley and was been voiding spontaneously. No gross hematuria. He continued to have dysuria. He was admitted to [facility] for fever and MS changes. Treated with IV  antibiotics for UTI. No records available. He continued on Cefuroxime.   Portions of the above documentation were copied from a prior visit for review purposes only.   Past Medical History:  No past medical history on file.  Past Surgical History:  No past surgical history on file.  Allergies:  Allergies  Allergen Reactions   Sulfamethoxazole-Trimethoprim Hives, Itching and Rash    HIVES AND ITCHING HIVES AND ITCHING    Nitrofurantoin Nausea And Vomiting    Other Reaction(s): GI Intolerance    Family History:  No family history on file.  Social History:  Social History   Tobacco Use   Smoking status: Never   Smokeless tobacco: Current    Types: Chew  Substance Use Topics   Alcohol use: Not Currently   Drug use: Never    ROS: Constitutional:  Negative for fever, chills, weight loss CV: Negative for chest pain, previous MI, hypertension Respiratory:  Negative for shortness of breath, wheezing, sleep apnea, frequent cough GI:  Negative for nausea, vomiting, bloody stool, GERD  Physical exam: BP (!) 141/81   Pulse 77  Ht 5\' 7"  (1.702 m)   Wt 226 lb (102.5 kg)   BMI 35.40 kg/m  GENERAL APPEARANCE:  Well appearing, well developed, well nourished, NAD HEENT:  Atraumatic, normocephalic, oropharynx clear NECK:  Supple without lymphadenopathy or thyromegaly ABDOMEN:  Soft, non-tender, no masses EXTREMITIES:  Moves all extremities well, without clubbing, cyanosis, or edema NEUROLOGIC:  Alert and oriented x 3, normal gait, CN II-XII grossly intact MENTAL STATUS:  appropriate BACK:  Non-tender to palpation, No CVAT SKIN:  Warm, dry, and intact GU:  tinea cruris bilaterally   Results: U/A:  11-30 WBC, >30 RBC

## 2023-08-21 ENCOUNTER — Encounter: Payer: Self-pay | Admitting: Urology

## 2023-08-21 ENCOUNTER — Telehealth: Payer: Self-pay

## 2023-08-21 NOTE — Telephone Encounter (Signed)
-----   Message from Di Kindle sent at 08/21/2023  9:10 AM EST ----- Please notify Alexander King that his urine culture did not show evidence of UTI. I would recommend that he complete the Cipro x 5 days as prescribed.   He should also complete the fluconazole as prescribed.   We will repeat a urinalysis at his next visit.

## 2023-08-21 NOTE — Telephone Encounter (Signed)
Left msg for a return call from pt regarding results.

## 2023-08-21 NOTE — Telephone Encounter (Signed)
Spoke with patient, he is aware of the results and the recommendations. Pt verbalized understanding.

## 2023-08-30 ENCOUNTER — Telehealth: Payer: Self-pay | Admitting: Urology

## 2023-08-30 NOTE — Telephone Encounter (Signed)
Patient is still having a burning feeling. It never went away. Wants to know if there is something else that could be prescribed to him

## 2023-09-01 ENCOUNTER — Other Ambulatory Visit: Payer: Self-pay | Admitting: Urology

## 2023-09-01 DIAGNOSIS — N138 Other obstructive and reflux uropathy: Secondary | ICD-10-CM

## 2023-09-01 MED ORDER — TAMSULOSIN HCL 0.4 MG PO CAPS: 0.4000 mg | ORAL_CAPSULE | Freq: Every day | ORAL | 5 refills | Status: DC

## 2023-09-07 ENCOUNTER — Encounter: Payer: Self-pay | Admitting: Urology

## 2023-09-07 ENCOUNTER — Ambulatory Visit: Payer: Medicare PPO | Admitting: Urology

## 2023-09-07 VITALS — BP 116/73 | HR 74 | Ht 67.0 in | Wt 225.0 lb

## 2023-09-07 DIAGNOSIS — Z87440 Personal history of urinary (tract) infections: Secondary | ICD-10-CM

## 2023-09-07 DIAGNOSIS — N401 Enlarged prostate with lower urinary tract symptoms: Secondary | ICD-10-CM

## 2023-09-07 DIAGNOSIS — B356 Tinea cruris: Secondary | ICD-10-CM | POA: Diagnosis not present

## 2023-09-07 DIAGNOSIS — N138 Other obstructive and reflux uropathy: Secondary | ICD-10-CM

## 2023-09-07 DIAGNOSIS — N419 Inflammatory disease of prostate, unspecified: Secondary | ICD-10-CM | POA: Diagnosis not present

## 2023-09-07 DIAGNOSIS — C61 Malignant neoplasm of prostate: Secondary | ICD-10-CM | POA: Diagnosis not present

## 2023-09-07 DIAGNOSIS — R300 Dysuria: Secondary | ICD-10-CM

## 2023-09-07 MED ORDER — LEVOFLOXACIN 500 MG PO TABS: 500.0000 mg | ORAL_TABLET | Freq: Every day | ORAL | 0 refills | Status: DC

## 2023-09-07 NOTE — Progress Notes (Signed)
Assessment: 1. Prostatitis, unspecified prostatitis type   2. BPH with obstruction/lower urinary tract symptoms   3. Prostate cancer (HCC); Gleason 6; T1c: on active surveillance since 7/18   4. History of UTI   5. Dysuria   6. Tinea cruris     Plan: Recommend Levaquin 500 mg daily x 21 days for prostatitis. Continue tamsulosin 0.4 mg daily. Return to office in 1 month for possible cystoscopy if no improvement in his symptoms. Recommend prostate MRI for continued evaluation of low risk prostate cancer on active surveillance   Chief Complaint:  Chief Complaint  Patient presents with   Benign Prostatic Hypertrophy    History of Present Illness:  Alexander King is a 77 y.o. male who is seen for evaluation of prostate cancer, BPH with obstruction, neurogenic bladder, urge incontinence, and history of UTIs. He was previously followed at [facility] in [facility].  He was last seen by me in June 2022.  At his visit in 8/24, he reported improvement in his lower urinary tract symptoms.  He was no longer taking tamsulosin and was no longer performing intermittent catheterization.  He continued on Myrbetriq 25 mg daily.  He felt like he emptied his bladder well.  He continued with some frequency and urgency.  He had not had any significant UTI's for the past 2 years.  He noted some dysuria  about 2 weeks prior.  He had recently taken a dose of fosfomycin and fluconazole.  No gross hematuria or flank pain.   IPSS = 4 Urine culture: <10K colonies PSA 2.8  At his visit in 11/24, he reported dysuria x 3 weeks.  His urinary symptoms were very stable up until that point.  He had increased frequency, urgency, and some urge incontinence in association with the dysuria.  No gross hematuria.  He was evaluated with a urinalysis on 08/07/2023 which showed 2+ leukocytes, and positive blood on dipstick.  Urine culture grew 25-50 K mixed flora.  He was not treated with any antibiotics.  He has  recently taken fosfomycin. He was no longer taking Myrbetriq. IPSS = 21. He also reported a erythematous rash in both groin areas with some discomfort.  He has been using topical nystatin cream and powder without benefit. He was treated with Cipro x 5 days. Resolve MDX urine culture showed no growth. He was also started on fluconazole for the tinea cruris. He continued to have dysuria.  He was started on tamsulosin approximately 5 days ago.  He returns today for follow-up.  He continues with frequency and decreased stream.  He also has constant burning in the bladder and urethral area which is increased at the end of voiding.  No gross hematuria or flank pain. IPSS = 20 today.  Urologic History: He presented with urinary incontinence. He had onset of incontinence without sensory awareness following lumbar surgery in April 2017. He underwent a L4-5 fusion. He had frequency, urgency, and incontinence since that time. No symptoms prior to surgery. He was using 4 pullups/day. He tried tamsulosin and Myrbetriq without benefit. He has been evaluated with MRI and nerve conduction studies. No fecal incontinence. He was given a trial of Vesicare but discontinued due to dizziness. He was given samples of Toviaz and noted some slight improvement in his frequency and urgency but continued to have incontinence.  PVR at prior visit was 25 mL. Cystoscopy from August 2017 showed lateral lobe enlargement with a median lobe, minimal trabeculations within the bladder. Urodynamics showed a  small capacity noncompliant bladder with associated incontinence. He was started on clean intermittent catheterization after his visit on 06/07/16.  He continues to have significant lower urinary tract symptoms including frequency, urgency, nocturia x4, sensation of incomplete emptying, hesitancy, and dribbling. He also has occasional incontinence. He has been taking tamsulosin and Vesicare. No dysuria or gross hematuria. AUA score =  14. Urodynamics from 03/12/2020 showed decreased capacity, instability without leakage, high pressure voiding with obstructed flow pattern, increased EMG activity during voiding, with post void residual of 89 mL. He continued with lower urinary tract symptoms. He has urgency, frequency, intermittent stream, hesitancy, decreased force of stream, and nocturia. He does not feel like he empties his bladder completely. He was not having any dysuria or gross hematuria.  AUA score = 20. Cystoscopy from 06/02/2020 showed lateral lobe enlargement with a median lobe and bladder trabeculations.  Urine culture from 12/18 grew Klebsiella. Treated with Cipro.  He continued on daily TMP. Urine culture from 1/19 grew Klebsiella. Treated with Augmentin. He was started on ampicillin daily. Urine culture from 2/19 grew Klebsiella.  He discontinued the daily ampicillin.  He was diagnosed with a symptomatic UTI in July 2020. Urine culture grew 25-50 K staph epidermidis. He was diagnosed with another symptomatic UTI on 06/12/2019. Urine culture grew >100 K Klebsiella. He was treated with Cipro.  He reports being treated for UTIs x2 in October. He was started on daily Keflex in late October 2020.  He was treated for a UTI in December 2020.  Urine culture from 3/21 grew >100 K Klebsiella. He was treated with Cipro. Urine culture from 6/21 grew >100K MRSA. Treated with Macrobid.  He has a history of UTI symptoms. A urinalysis from 07/02/2020 showed blood on dipstick. No urine culture was obtained. He was treated with Cipro x7 days. He reports return of his symptoms shortly after completing the Cipro. A urinalysis from 07/13/2020 showed 1+ leukocyte esterase and positive blood.  He was scheduled for a TURP on 07/16/2020. He continued to perform intermittent catheterization. He reported worsening of his urinary symptoms including increased frequency, urgency, and nocturia.  He has been evaluated by Dr. Trisha Mangle in 9/21 and was  started on methenamine for UTI prevention. Urine culture from 2/22: >100 K Enterococcus. Treated with Cipro Urine culture from 2/22: No growth Urine culture from 3/22: No growth  He underwent a renal ultrasound in May 2017 which showed left renal pelvic caliectasis and a moderately distended bladder. He also had a scrotal ultrasound in May 2017 which showed asymmetric heterogeneous appearance of the left testicle as compared to the right without obvious testicular mass. A repeat ultrasound in 3 months was recommended to evaluate for stability. Scrotal ultrasound from 06/21/16 showed a normal right testicle without mass or microlithiasis, heterogeneous echotexture to the left testicle without obvious mass lesion, bilateral epididymal cyst, and small bilateral hydroceles. Scrotal U/S from 3/18 showed heterogeneity of the left testicle, decreased from prior exam, without obvious mass, bilateral epididymal cysts, and small bilateral hydroceles.   Cr from 2/18 was 2.49. Cr from 4/18 was 2.85. Cr from 9/18 was 2.16. Cr from 5/19 is 1.9. Cr from 9/19 was 1.7. Cr from 1/20 was 1.8. Cr from 5/20 was 1.7.  Creatinine from 1/21 was 1.7. Creatinine from 5/21: 1.6  PSA from 2/18 5.0. PSA from 4/18 was 13.2 (drawn at time of UTI). 4K score from 03/24/17 showed a PSA of 4.18, free PSA of 20%, and 14% probability of aggressive prostate cancer on biopsy.  Rectal culture showed no FQ resistant organisms.  He underwent transrectal ultrasound and biopsy of the prostate on 04/20/17. PSA: 4.18 ng/ml DRE: normal TRUS volume: 54.72 ml Biopsy results:  Gleason score: 3+ 3 = 6 # positive cores: 1/6 on right 0/6 on left Location of cancer: base  He elected to proceed with active surveillance for management of his localized prostate cancer.  PSA from 06/19/17: 3.37 PSA from 10/04/17: 7.51 (Drawn at time of UTI) PSA from 11/15/17: 5.23 PSA from 01/31/18: 3.10 PSA from 06/14/18: 1.68 Prostate MRI from 07/12/2018 showed no  evidence of macroscopic or high-grade carcinoma, multiple areas of ill-defined hypointensity in the central gland likely related to BPH, PI-RADS 2-3. PSA from 10/18/18: 2.83 PSA from 5/20: 1.98 PSA from 9/20: 2.16 PSA from 1/21: 2.82 PSA from 5/21: 2.39 PSA from 1/22: 3.11 PSA from 8/22: 3.15  CT urogram from 12/21 showed a indeterminate density lesion in the upper pole of the left kidney approximately 13 mm in size. No kidney stones or obstruction noted. Further evaluation with a MRI showed a hemorrhagic cyst involving the left kidney.  He reports being treated for a UTI with a urine culture from 01/03/2021 showing 1000 colonies of Enterococcus. He was treated with Macrobid. He reported developing some fever and chills. He was changed to Levaquin. His fevers esolved. He was previously having frequency and incontinence. He is no longer incontinent. He has been unable to void spontaneously. He was catheterizing 5-6 times per day with unknown volumes. He continued on Flomax, Vesicare, and recently started taking trospium. AUA score = 20.  He underwent a TURP on 02/01/2021. Pathology showed benign prostate tissue. He was discharged with the Foley catheter in place which he removed several days later. He then resumed intermittent catheterization.  At his postop visit on 02/17/2021, he was voiding spontaneously with a good stream. No dysuria or gross hematuria. He had noted some cloudy urine. He had not catheterized in over a week. He continued on Flomax. He noted some swelling and pain of the right scrotum. Urine culture grew 50-100 K MRSA. He was also diagnosed with epididymitis. He was treated with doxycycline.  At his visit on 03/04/21, he reported worsening problems with difficulty voiding. He was having nearly continuous leakage of urine. He attempted intermittent catheterization but was unable to pass the catheter and had only return of small clots. He continued to have swelling and discomfort in the  right scrotum. He continued on doxycycline. Cystoscopy demonstrated a normal urethra, widely patent prostatic urethra s/p TURP, diffuse erythema of bladder with mucosal edema. He continued to have right scrotal swelling and pain. He was treated with fosfomycin daily x 3 days and fluconazole. A foley was placed. He removed the foley and was been voiding spontaneously. No gross hematuria. He continued to have dysuria. He was admitted to [facility] for fever and MS changes. Treated with IV antibiotics for UTI. No records available. He continued on Cefuroxime.   Portions of the above documentation were copied from a prior visit for review purposes only.   Past Medical History:  Past Medical History:  Diagnosis Date   Acid reflux    Arthritis    Diabetes mellitus without complication (HCC)    High cholesterol    History of kidney cancer    Hypertension    OSA (obstructive sleep apnea)     Past Surgical History:  Past Surgical History:  Procedure Laterality Date   CHOLECYSTECTOMY     LUMBAR DISC SURGERY  NEPHRECTOMY     TRANSURETHRAL RESECTION OF PROSTATE      Allergies:  Allergies  Allergen Reactions   Sulfamethoxazole-Trimethoprim Hives, Itching and Rash    HIVES AND ITCHING HIVES AND ITCHING    Nitrofurantoin Nausea And Vomiting    Other Reaction(s): GI Intolerance    Family History:  History reviewed. No pertinent family history.  Social History:  Social History   Tobacco Use   Smoking status: Never   Smokeless tobacco: Current    Types: Chew  Substance Use Topics   Alcohol use: Not Currently   Drug use: Never    ROS: Constitutional:  Negative for fever, chills, weight loss CV: Negative for chest pain, previous MI, hypertension Respiratory:  Negative for shortness of breath, wheezing, sleep apnea, frequent cough GI:  Negative for nausea, vomiting, bloody stool, GERD  Physical exam: BP 116/73   Pulse 74   Ht 5\' 7"  (1.702 m)   Wt 225 lb (102.1 kg)    BMI 35.24 kg/m  GENERAL APPEARANCE:  Well appearing, well developed, well nourished, NAD HEENT:  Atraumatic, normocephalic, oropharynx clear NECK:  Supple without lymphadenopathy or thyromegaly ABDOMEN:  Soft, non-tender, no masses EXTREMITIES:  Moves all extremities well, without clubbing, cyanosis, or edema NEUROLOGIC:  Alert and oriented x 3, normal gait, CN II-XII grossly intact MENTAL STATUS:  appropriate BACK:  Non-tender to palpation, No CVAT SKIN:  Warm, dry, and intact GU: Prostate: tender to palpation Rectum: Normal tone,  no masses or tenderness   Results: U/A: 6-10 WBC, 3-10 RBC, few bacteria

## 2023-09-08 LAB — URINALYSIS, ROUTINE W REFLEX MICROSCOPIC
Bilirubin, UA: NEGATIVE
Ketones, UA: NEGATIVE
Leukocytes,UA: NEGATIVE
Nitrite, UA: NEGATIVE
Specific Gravity, UA: 1.025 (ref 1.005–1.030)
Urobilinogen, Ur: 0.2 mg/dL (ref 0.2–1.0)
pH, UA: 6.5 (ref 5.0–7.5)

## 2023-09-08 LAB — MICROSCOPIC EXAMINATION

## 2023-09-12 ENCOUNTER — Telehealth: Payer: Self-pay | Admitting: Urology

## 2023-09-12 NOTE — Telephone Encounter (Signed)
Spoke with pt, he is aware of Dr. Scharlene Gloss recommendations and verified follow up appt with Dr. Pete Glatter.

## 2023-09-12 NOTE — Telephone Encounter (Signed)
Patient called and LVM stating he still has burning and pressure in his private area.

## 2023-09-12 NOTE — Telephone Encounter (Signed)
Left msg for a return call from patient.  

## 2023-09-18 ENCOUNTER — Telehealth: Payer: Self-pay

## 2023-09-18 DIAGNOSIS — E1142 Type 2 diabetes mellitus with diabetic polyneuropathy: Secondary | ICD-10-CM | POA: Diagnosis not present

## 2023-09-18 DIAGNOSIS — C61 Malignant neoplasm of prostate: Secondary | ICD-10-CM | POA: Diagnosis not present

## 2023-09-18 DIAGNOSIS — N183 Chronic kidney disease, stage 3 unspecified: Secondary | ICD-10-CM | POA: Diagnosis not present

## 2023-09-18 NOTE — Telephone Encounter (Signed)
Pt called to report that he has broke out on a rash on his arms and back. It is red and itchy. Advised pt not to take any more levaquin until he hears back from our office.   Pt reports that he would like to still be one something because he is still having the burning.

## 2023-09-19 ENCOUNTER — Other Ambulatory Visit: Payer: Self-pay | Admitting: Urology

## 2023-09-19 MED ORDER — CEFDINIR 300 MG PO CAPS: 300.0000 mg | ORAL_CAPSULE | Freq: Two times a day (BID) | ORAL | 0 refills | Status: AC

## 2023-09-21 NOTE — Telephone Encounter (Signed)
Pt aware of cefdinir prescription. He has already picked it up and gotten it started.

## 2023-09-25 DIAGNOSIS — E1165 Type 2 diabetes mellitus with hyperglycemia: Secondary | ICD-10-CM | POA: Diagnosis not present

## 2023-09-25 DIAGNOSIS — E782 Mixed hyperlipidemia: Secondary | ICD-10-CM | POA: Diagnosis not present

## 2023-09-25 DIAGNOSIS — I10 Essential (primary) hypertension: Secondary | ICD-10-CM | POA: Diagnosis not present

## 2023-09-25 DIAGNOSIS — Z6836 Body mass index (BMI) 36.0-36.9, adult: Secondary | ICD-10-CM | POA: Diagnosis not present

## 2023-09-25 DIAGNOSIS — J069 Acute upper respiratory infection, unspecified: Secondary | ICD-10-CM | POA: Diagnosis not present

## 2023-10-04 DIAGNOSIS — T148XXA Other injury of unspecified body region, initial encounter: Secondary | ICD-10-CM | POA: Diagnosis not present

## 2023-10-04 DIAGNOSIS — R062 Wheezing: Secondary | ICD-10-CM | POA: Diagnosis not present

## 2023-10-04 DIAGNOSIS — J209 Acute bronchitis, unspecified: Secondary | ICD-10-CM | POA: Diagnosis not present

## 2023-10-06 ENCOUNTER — Telehealth: Payer: Self-pay | Admitting: Urology

## 2023-10-06 NOTE — Telephone Encounter (Signed)
 Patient called and lvm he stated his right testicle is 3x larger then his left. Also that it is extremely painful with contact. Patient would like to know what to do. Thank you

## 2023-10-06 NOTE — Telephone Encounter (Signed)
 Returned pts call, no answer. Left msg on VM advising pt there was no Dr in the office today and he should seek care at an Urgent Care, PCP or ED.

## 2023-10-11 DIAGNOSIS — Z79899 Other long term (current) drug therapy: Secondary | ICD-10-CM | POA: Diagnosis not present

## 2023-10-11 DIAGNOSIS — E860 Dehydration: Secondary | ICD-10-CM | POA: Diagnosis not present

## 2023-10-11 DIAGNOSIS — I10 Essential (primary) hypertension: Secondary | ICD-10-CM | POA: Diagnosis not present

## 2023-10-11 DIAGNOSIS — Z7982 Long term (current) use of aspirin: Secondary | ICD-10-CM | POA: Diagnosis not present

## 2023-10-11 DIAGNOSIS — E119 Type 2 diabetes mellitus without complications: Secondary | ICD-10-CM | POA: Diagnosis not present

## 2023-10-11 DIAGNOSIS — R42 Dizziness and giddiness: Secondary | ICD-10-CM | POA: Diagnosis not present

## 2023-10-11 DIAGNOSIS — R000 Tachycardia, unspecified: Secondary | ICD-10-CM | POA: Diagnosis not present

## 2023-10-11 DIAGNOSIS — R079 Chest pain, unspecified: Secondary | ICD-10-CM | POA: Diagnosis not present

## 2023-10-11 DIAGNOSIS — Z7985 Long-term (current) use of injectable non-insulin antidiabetic drugs: Secondary | ICD-10-CM | POA: Diagnosis not present

## 2023-10-11 DIAGNOSIS — Z794 Long term (current) use of insulin: Secondary | ICD-10-CM | POA: Diagnosis not present

## 2023-10-11 DIAGNOSIS — R55 Syncope and collapse: Secondary | ICD-10-CM | POA: Diagnosis not present

## 2023-10-12 ENCOUNTER — Ambulatory Visit: Payer: Medicare PPO | Admitting: Urology

## 2023-10-12 NOTE — Progress Notes (Deleted)
Assessment: 1. BPH with obstruction/lower urinary tract symptoms   2. Prostate cancer (HCC); Gleason 6; T1c: on active surveillance since 7/18   3. Dysuria   4. Prostatitis, unspecified prostatitis type   5. History of UTI     Plan: Continue tamsulosin 0.4 mg daily. Return to office in 1 month for possible cystoscopy if no improvement in his symptoms. Recommend prostate MRI for continued evaluation of low risk prostate cancer on active surveillance   Chief Complaint:  No chief complaint on file.   History of Present Illness:  Alexander King is a 78 y.o. male who is seen for evaluation of prostate cancer, BPH with obstruction, neurogenic bladder, urge incontinence, and history of UTIs. He was previously followed at [facility] health urology in [facility].  He was last seen by me in June 2022.  At his visit in 8/24, he reported improvement in his lower urinary tract symptoms.  He was no longer taking tamsulosin and was no longer performing intermittent catheterization.  He continued on Myrbetriq 25 mg daily.  He felt like he emptied his bladder well.  He continued with some frequency and urgency.  He had not had any significant UTI's for the past 2 years.  He noted some dysuria  about 2 weeks prior.  He had recently taken a dose of fosfomycin and fluconazole.  No gross hematuria or flank pain.   IPSS = 4 Urine culture: <10K colonies PSA 2.8  At his visit in 11/24, he reported dysuria x 3 weeks.  His urinary symptoms were very stable up until that point.  He had increased frequency, urgency, and some urge incontinence in association with the dysuria.  No gross hematuria.  He was evaluated with a urinalysis on 08/07/2023 which showed 2+ leukocytes, and positive blood on dipstick.  Urine culture grew 25-50 K mixed flora.  He was not treated with any antibiotics.  He has recently taken fosfomycin. He was no longer taking Myrbetriq. IPSS = 21. He also reported a erythematous rash in both groin  areas with some discomfort.  He has been using topical nystatin cream and powder without benefit. He was treated with Cipro x 5 days. Resolve MDX urine culture showed no growth. He was also started on fluconazole for the tinea cruris. He continued to have dysuria.  He was started on tamsulosin. He continued with frequency and decreased stream.  He also had constant burning in the bladder and urethral area which was increased at the end of voiding.  No gross hematuria or flank pain. IPSS = 20. He was treated with Levaquin for prostatitis in 12/24.  He developed a rash which was possibly due to the Levaquin.  He was changed to cefdinir.  He returns today for follow-up.  Urologic History: He presented with urinary incontinence. He had onset of incontinence without sensory awareness following lumbar surgery in April 2017. He underwent a L4-5 fusion. He had frequency, urgency, and incontinence since that time. No symptoms prior to surgery. He was using 4 pullups/day. He tried tamsulosin and Myrbetriq without benefit. He has been evaluated with MRI and nerve conduction studies. No fecal incontinence. He was given a trial of Vesicare but discontinued due to dizziness. He was given samples of Toviaz and noted some slight improvement in his frequency and urgency but continued to have incontinence.  PVR at prior visit was 25 mL. Cystoscopy from August 2017 showed lateral lobe enlargement with a median lobe, minimal trabeculations within the bladder. Urodynamics showed a small  capacity noncompliant bladder with associated incontinence. He was started on clean intermittent catheterization after his visit on 06/07/16.  He continues to have significant lower urinary tract symptoms including frequency, urgency, nocturia x4, sensation of incomplete emptying, hesitancy, and dribbling. He also has occasional incontinence. He has been taking tamsulosin and Vesicare. No dysuria or gross hematuria. AUA score =  14. Urodynamics from 03/12/2020 showed decreased capacity, instability without leakage, high pressure voiding with obstructed flow pattern, increased EMG activity during voiding, with post void residual of 89 mL. He continued with lower urinary tract symptoms. He has urgency, frequency, intermittent stream, hesitancy, decreased force of stream, and nocturia. He does not feel like he empties his bladder completely. He was not having any dysuria or gross hematuria.  AUA score = 20. Cystoscopy from 06/02/2020 showed lateral lobe enlargement with a median lobe and bladder trabeculations.  Urine culture from 12/18 grew Klebsiella. Treated with Cipro.  He continued on daily TMP. Urine culture from 1/19 grew Klebsiella. Treated with Augmentin. He was started on ampicillin daily. Urine culture from 2/19 grew Klebsiella.  He discontinued the daily ampicillin.  He was diagnosed with a symptomatic UTI in July 2020. Urine culture grew 25-50 K staph epidermidis. He was diagnosed with another symptomatic UTI on 06/12/2019. Urine culture grew >100 K Klebsiella. He was treated with Cipro.  He reports being treated for UTIs x2 in October. He was started on daily Keflex in late October 2020.  He was treated for a UTI in December 2020.  Urine culture from 3/21 grew >100 K Klebsiella. He was treated with Cipro. Urine culture from 6/21 grew >100K MRSA. Treated with Macrobid.  He has a history of UTI symptoms. A urinalysis from 07/02/2020 showed blood on dipstick. No urine culture was obtained. He was treated with Cipro x7 days. He reports return of his symptoms shortly after completing the Cipro. A urinalysis from 07/13/2020 showed 1+ leukocyte esterase and positive blood.  He was scheduled for a TURP on 07/16/2020. He continued to perform intermittent catheterization. He reported worsening of his urinary symptoms including increased frequency, urgency, and nocturia.  He has been evaluated by Dr. Trisha Mangle in 9/21 and was  started on methenamine for UTI prevention. Urine culture from 2/22: >100 K Enterococcus. Treated with Cipro Urine culture from 2/22: No growth Urine culture from 3/22: No growth  He underwent a renal ultrasound in May 2017 which showed left renal pelvic caliectasis and a moderately distended bladder. He also had a scrotal ultrasound in May 2017 which showed asymmetric heterogeneous appearance of the left testicle as compared to the right without obvious testicular mass. A repeat ultrasound in 3 months was recommended to evaluate for stability. Scrotal ultrasound from 06/21/16 showed a normal right testicle without mass or microlithiasis, heterogeneous echotexture to the left testicle without obvious mass lesion, bilateral epididymal cyst, and small bilateral hydroceles. Scrotal U/S from 3/18 showed heterogeneity of the left testicle, decreased from prior exam, without obvious mass, bilateral epididymal cysts, and small bilateral hydroceles.   Cr from 2/18 was 2.49. Cr from 4/18 was 2.85. Cr from 9/18 was 2.16. Cr from 5/19 is 1.9. Cr from 9/19 was 1.7. Cr from 1/20 was 1.8. Cr from 5/20 was 1.7.  Creatinine from 1/21 was 1.7. Creatinine from 5/21: 1.6  PSA from 2/18 5.0. PSA from 4/18 was 13.2 (drawn at time of UTI). 4K score from 03/24/17 showed a PSA of 4.18, free PSA of 20%, and 14% probability of aggressive prostate cancer on biopsy. Rectal  culture showed no FQ resistant organisms.  He underwent transrectal ultrasound and biopsy of the prostate on 04/20/17. PSA: 4.18 ng/ml DRE: normal TRUS volume: 54.72 ml Biopsy results:  Gleason score: 3+ 3 = 6 # positive cores: 1/6 on right 0/6 on left Location of cancer: base  He elected to proceed with active surveillance for management of his localized prostate cancer.  PSA from 06/19/17: 3.37 PSA from 10/04/17: 7.51 (Drawn at time of UTI) PSA from 11/15/17: 5.23 PSA from 01/31/18: 3.10 PSA from 06/14/18: 1.68 Prostate MRI from 07/12/2018 showed no  evidence of macroscopic or high-grade carcinoma, multiple areas of ill-defined hypointensity in the central gland likely related to BPH, PI-RADS 2-3. PSA from 10/18/18: 2.83 PSA from 5/20: 1.98 PSA from 9/20: 2.16 PSA from 1/21: 2.82 PSA from 5/21: 2.39 PSA from 1/22: 3.11 PSA from 8/22: 3.15  CT urogram from 12/21 showed a indeterminate density lesion in the upper pole of the left kidney approximately 13 mm in size. No kidney stones or obstruction noted. Further evaluation with a MRI showed a hemorrhagic cyst involving the left kidney.  He reports being treated for a UTI with a urine culture from 01/03/2021 showing 1000 colonies of Enterococcus. He was treated with Macrobid. He reported developing some fever and chills. He was changed to Levaquin. His fevers esolved. He was previously having frequency and incontinence. He is no longer incontinent. He has been unable to void spontaneously. He was catheterizing 5-6 times per day with unknown volumes. He continued on Flomax, Vesicare, and recently started taking trospium. AUA score = 20.  He underwent a TURP on 02/01/2021. Pathology showed benign prostate tissue. He was discharged with the Foley catheter in place which he removed several days later. He then resumed intermittent catheterization.  At his postop visit on 02/17/2021, he was voiding spontaneously with a good stream. No dysuria or gross hematuria. He had noted some cloudy urine. He had not catheterized in over a week. He continued on Flomax. He noted some swelling and pain of the right scrotum. Urine culture grew 50-100 K MRSA. He was also diagnosed with epididymitis. He was treated with doxycycline.  At his visit on 03/04/21, he reported worsening problems with difficulty voiding. He was having nearly continuous leakage of urine. He attempted intermittent catheterization but was unable to pass the catheter and had only return of small clots. He continued to have swelling and discomfort in the  right scrotum. He continued on doxycycline. Cystoscopy demonstrated a normal urethra, widely patent prostatic urethra s/p TURP, diffuse erythema of bladder with mucosal edema. He continued to have right scrotal swelling and pain. He was treated with fosfomycin daily x 3 days and fluconazole. A foley was placed. He removed the foley and was been voiding spontaneously. No gross hematuria. He continued to have dysuria. He was admitted to [facility] for fever and MS changes. Treated with IV antibiotics for UTI. No records available. He continued on Cefuroxime.   Portions of the above documentation were copied from a prior visit for review purposes only.   Past Medical History:  Past Medical History:  Diagnosis Date   Acid reflux    Arthritis    Diabetes mellitus without complication (HCC)    High cholesterol    History of kidney cancer    Hypertension    OSA (obstructive sleep apnea)     Past Surgical History:  Past Surgical History:  Procedure Laterality Date   CHOLECYSTECTOMY     LUMBAR DISC SURGERY  NEPHRECTOMY     TRANSURETHRAL RESECTION OF PROSTATE      Allergies:  Allergies  Allergen Reactions   Sulfamethoxazole-Trimethoprim Hives, Itching and Rash    HIVES AND ITCHING HIVES AND ITCHING    Nitrofurantoin Nausea And Vomiting    Other Reaction(s): GI Intolerance    Family History:  No family history on file.  Social History:  Social History   Tobacco Use   Smoking status: Never   Smokeless tobacco: Current    Types: Chew  Substance Use Topics   Alcohol use: Not Currently   Drug use: Never    ROS: Constitutional:  Negative for fever, chills, weight loss CV: Negative for chest pain, previous MI, hypertension Respiratory:  Negative for shortness of breath, wheezing, sleep apnea, frequent cough GI:  Negative for nausea, vomiting, bloody stool, GERD   Physical exam: There were no vitals taken for this visit. GENERAL APPEARANCE:  Well appearing, well  developed, well nourished, NAD HEENT:  Atraumatic, normocephalic, oropharynx clear NECK:  Supple without lymphadenopathy or thyromegaly ABDOMEN:  Soft, non-tender, no masses EXTREMITIES:  Moves all extremities well, without clubbing, cyanosis, or edema NEUROLOGIC:  Alert and oriented x 3, normal gait, CN II-XII grossly intact MENTAL STATUS:  appropriate BACK:  Non-tender to palpation, No CVAT SKIN:  Warm, dry, and intact   Results: U/A:   Procedure:  Flexible Cystourethroscopy  Pre-operative Diagnosis: {cysto diagnosis:26394}  Post-operative Diagnosis: {cysto diagnosis:26394}  Anesthesia:  local with lidocaine jelly  Surgical Narrative:  After appropriate informed consent was obtained, the patient was prepped and draped in the usual sterile fashion in the supine position.  The patient was correctly identified and the proper procedure delineated prior to proceeding.  Sterile lidocaine gel was instilled in the urethra. The flexible cystoscope was introduced without difficulty.  Findings:  Anterior urethra: {anterior urethral findings:26395}  Posterior urethra: {post urethral findings:26396}  Bladder: {bladder findings:26397}  Ureteral orifices: {Normal/Abnormal Appearance:21344::"normal"}  Additional findings:  Saline bladder wash for cytology {WAS/WAS NOT:[phone]::"was not"} performed.    The cystoscope was then removed.  The patient tolerated the procedure well.

## 2023-10-19 DIAGNOSIS — L89309 Pressure ulcer of unspecified buttock, unspecified stage: Secondary | ICD-10-CM | POA: Diagnosis not present

## 2023-10-19 DIAGNOSIS — B372 Candidiasis of skin and nail: Secondary | ICD-10-CM | POA: Diagnosis not present

## 2023-10-19 DIAGNOSIS — Z6836 Body mass index (BMI) 36.0-36.9, adult: Secondary | ICD-10-CM | POA: Diagnosis not present

## 2023-10-27 ENCOUNTER — Encounter: Payer: Self-pay | Admitting: Urology

## 2023-10-27 ENCOUNTER — Ambulatory Visit: Payer: Medicare PPO | Admitting: Urology

## 2023-10-27 VITALS — BP 114/70 | HR 71 | Ht 67.0 in | Wt 220.0 lb

## 2023-10-27 DIAGNOSIS — N401 Enlarged prostate with lower urinary tract symptoms: Secondary | ICD-10-CM | POA: Diagnosis not present

## 2023-10-27 DIAGNOSIS — Z87440 Personal history of urinary (tract) infections: Secondary | ICD-10-CM | POA: Diagnosis not present

## 2023-10-27 DIAGNOSIS — N138 Other obstructive and reflux uropathy: Secondary | ICD-10-CM | POA: Diagnosis not present

## 2023-10-27 DIAGNOSIS — C61 Malignant neoplasm of prostate: Secondary | ICD-10-CM

## 2023-10-27 DIAGNOSIS — N3941 Urge incontinence: Secondary | ICD-10-CM

## 2023-10-27 DIAGNOSIS — B356 Tinea cruris: Secondary | ICD-10-CM | POA: Diagnosis not present

## 2023-10-27 DIAGNOSIS — R300 Dysuria: Secondary | ICD-10-CM | POA: Diagnosis not present

## 2023-10-27 LAB — MICROSCOPIC EXAMINATION
Bacteria, UA: NONE SEEN
Crystals: NONE SEEN
RBC, Urine: NONE SEEN /[HPF] (ref 0–2)

## 2023-10-27 LAB — URINALYSIS, ROUTINE W REFLEX MICROSCOPIC
Bilirubin, UA: NEGATIVE
Ketones, UA: NEGATIVE
Leukocytes,UA: NEGATIVE
Nitrite, UA: NEGATIVE
RBC, UA: NEGATIVE
Specific Gravity, UA: 1.025 (ref 1.005–1.030)
Urobilinogen, Ur: 0.2 mg/dL (ref 0.2–1.0)
pH, UA: 5.5 (ref 5.0–7.5)

## 2023-10-27 MED ORDER — MIRABEGRON ER 25 MG PO TB24: 25.0000 mg | ORAL_TABLET | Freq: Every day | ORAL | 2 refills | Status: DC

## 2023-10-27 NOTE — Progress Notes (Signed)
Assessment: 1. BPH with obstruction/lower urinary tract symptoms   2. Prostate cancer (HCC); Gleason 6; T1c: on active surveillance since 7/18   3. History of UTI   4. Tinea cruris   5. Urge incontinence     Plan: Continue tamsulosin 0.4 mg daily. PSA today Consider resuming Myrbetriq 25 mg daily for urge incontinence symptoms.  Prescription sent. Recommend prostate MRI for continued evaluation of low risk prostate cancer on active surveillance Recommend dermatology consultation for persistent tinea cruris Return to office in 3 months  Chief Complaint:  Chief Complaint  Patient presents with   Benign Prostatic Hypertrophy    History of Present Illness:  Trygg Mantz is a 78 y.o. male who is seen for evaluation of prostate cancer, BPH with obstruction, neurogenic bladder, urge incontinence, and history of UTIs. He was previously followed at [facility] health urology in [facility].  He was last seen by me in June 2022.  At his visit in 8/24, he reported improvement in his lower urinary tract symptoms.  He was no longer taking tamsulosin and was no longer performing intermittent catheterization.  He continued on Myrbetriq 25 mg daily.  He felt like he emptied his bladder well.  He continued with some frequency and urgency.  He had not had any significant UTI's for the past 2 years.  He noted some dysuria  about 2 weeks prior.  He had recently taken a dose of fosfomycin and fluconazole.  No gross hematuria or flank pain.   IPSS = 4 Urine culture: <10K colonies PSA 2.8  At his visit in 11/24, he reported dysuria x 3 weeks.  His urinary symptoms were very stable up until that point.  He had increased frequency, urgency, and some urge incontinence in association with the dysuria.  No gross hematuria.  He was evaluated with a urinalysis on 08/07/2023 which showed 2+ leukocytes, and positive blood on dipstick.  Urine culture grew 25-50 K mixed flora.  He was not treated with any antibiotics.   He had recently taken fosfomycin. He was no longer taking Myrbetriq. IPSS = 21. He also reported a erythematous rash in both groin areas with some discomfort.  He has been using topical nystatin cream and powder without benefit. He was treated with Cipro x 5 days. Resolve MDX urine culture showed no growth. He was also started on fluconazole for the tinea cruris. He continued to have dysuria.  He was started on tamsulosin. He continued with frequency and decreased stream.  He also had constant burning in the bladder and urethral area which was increased at the end of voiding.  No gross hematuria or flank pain. IPSS = 20. He was treated with Levaquin for prostatitis in 12/24.  He developed a rash which was possibly due to the Levaquin.  He was changed to cefdinir.  He returns today for further evaluation and possible cystoscopy for evaluation of his dysuria and lower urinary tract symptoms.  He returns today for follow-up. He reports that his dysuria has resolved after taking some over-the-counter AZO.  He currently feels like his voiding symptoms are improved.  He does have some frequency, urgency and nocturia x 2.  No dysuria or gross hematuria. IPSS = 8 today. He continues on tamsulosin.  He does report some episodes of urge incontinence and postvoid dribbling requiring pad usage. He continues to have problems with tinea cruris.  He was itraconazole with improvement.  Urologic History: He presented with urinary incontinence. He had onset of incontinence without  sensory awareness following lumbar surgery in April 2017. He underwent a L4-5 fusion. He had frequency, urgency, and incontinence since that time. No symptoms prior to surgery. He was using 4 pullups/day. He tried tamsulosin and Myrbetriq without benefit. He has been evaluated with MRI and nerve conduction studies. No fecal incontinence. He was given a trial of Vesicare but discontinued due to dizziness. He was given samples of Toviaz  and noted some slight improvement in his frequency and urgency but continued to have incontinence.  PVR at prior visit was 25 mL. Cystoscopy from August 2017 showed lateral lobe enlargement with a median lobe, minimal trabeculations within the bladder. Urodynamics showed a small capacity noncompliant bladder with associated incontinence. He was started on clean intermittent catheterization after his visit on 06/07/16.  He continues to have significant lower urinary tract symptoms including frequency, urgency, nocturia x4, sensation of incomplete emptying, hesitancy, and dribbling. He also has occasional incontinence. He has been taking tamsulosin and Vesicare. No dysuria or gross hematuria. AUA score = 14. Urodynamics from 03/12/2020 showed decreased capacity, instability without leakage, high pressure voiding with obstructed flow pattern, increased EMG activity during voiding, with post void residual of 89 mL. He continued with lower urinary tract symptoms. He has urgency, frequency, intermittent stream, hesitancy, decreased force of stream, and nocturia. He does not feel like he empties his bladder completely. He was not having any dysuria or gross hematuria.  AUA score = 20. Cystoscopy from 06/02/2020 showed lateral lobe enlargement with a median lobe and bladder trabeculations.  Urine culture from 12/18 grew Klebsiella. Treated with Cipro.  He continued on daily TMP. Urine culture from 1/19 grew Klebsiella. Treated with Augmentin. He was started on ampicillin daily. Urine culture from 2/19 grew Klebsiella.  He discontinued the daily ampicillin.  He was diagnosed with a symptomatic UTI in July 2020. Urine culture grew 25-50 K staph epidermidis. He was diagnosed with another symptomatic UTI on 06/12/2019. Urine culture grew >100 K Klebsiella. He was treated with Cipro.  He reports being treated for UTIs x2 in October. He was started on daily Keflex in late October 2020.  He was treated for a UTI in  December 2020.  Urine culture from 3/21 grew >100 K Klebsiella. He was treated with Cipro. Urine culture from 6/21 grew >100K MRSA. Treated with Macrobid.  He has a history of UTI symptoms. A urinalysis from 07/02/2020 showed blood on dipstick. No urine culture was obtained. He was treated with Cipro x7 days. He reports return of his symptoms shortly after completing the Cipro. A urinalysis from 07/13/2020 showed 1+ leukocyte esterase and positive blood.  He was scheduled for a TURP on 07/16/2020. He continued to perform intermittent catheterization. He reported worsening of his urinary symptoms including increased frequency, urgency, and nocturia.  He has been evaluated by Dr. Trisha Mangle in 9/21 and was started on methenamine for UTI prevention. Urine culture from 2/22: >100 K Enterococcus. Treated with Cipro Urine culture from 2/22: No growth Urine culture from 3/22: No growth  He underwent a renal ultrasound in May 2017 which showed left renal pelvic caliectasis and a moderately distended bladder. He also had a scrotal ultrasound in May 2017 which showed asymmetric heterogeneous appearance of the left testicle as compared to the right without obvious testicular mass. A repeat ultrasound in 3 months was recommended to evaluate for stability. Scrotal ultrasound from 06/21/16 showed a normal right testicle without mass or microlithiasis, heterogeneous echotexture to the left testicle without obvious mass lesion, bilateral epididymal  cyst, and small bilateral hydroceles. Scrotal U/S from 3/18 showed heterogeneity of the left testicle, decreased from prior exam, without obvious mass, bilateral epididymal cysts, and small bilateral hydroceles.   Cr from 2/18 was 2.49. Cr from 4/18 was 2.85. Cr from 9/18 was 2.16. Cr from 5/19 is 1.9. Cr from 9/19 was 1.7. Cr from 1/20 was 1.8. Cr from 5/20 was 1.7.  Creatinine from 1/21 was 1.7. Creatinine from 5/21: 1.6  PSA from 2/18 5.0. PSA from 4/18 was 13.2 (drawn at  time of UTI). 4K score from 03/24/17 showed a PSA of 4.18, free PSA of 20%, and 14% probability of aggressive prostate cancer on biopsy. Rectal culture showed no FQ resistant organisms.  He underwent transrectal ultrasound and biopsy of the prostate on 04/20/17. PSA: 4.18 ng/ml DRE: normal TRUS volume: 54.72 ml Biopsy results:  Gleason score: 3+ 3 = 6 # positive cores: 1/6 on right 0/6 on left Location of cancer: base  He elected to proceed with active surveillance for management of his localized prostate cancer.  PSA from 06/19/17: 3.37 PSA from 10/04/17: 7.51 (Drawn at time of UTI) PSA from 11/15/17: 5.23 PSA from 01/31/18: 3.10 PSA from 06/14/18: 1.68 Prostate MRI from 07/12/2018 showed no evidence of macroscopic or high-grade carcinoma, multiple areas of ill-defined hypointensity in the central gland likely related to BPH, PI-RADS 2-3. PSA from 10/18/18: 2.83 PSA from 5/20: 1.98 PSA from 9/20: 2.16 PSA from 1/21: 2.82 PSA from 5/21: 2.39 PSA from 1/22: 3.11 PSA from 8/22: 3.15  CT urogram from 12/21 showed a indeterminate density lesion in the upper pole of the left kidney approximately 13 mm in size. No kidney stones or obstruction noted. Further evaluation with a MRI showed a hemorrhagic cyst involving the left kidney.  He reports being treated for a UTI with a urine culture from 01/03/2021 showing 1000 colonies of Enterococcus. He was treated with Macrobid. He reported developing some fever and chills. He was changed to Levaquin. His fevers esolved. He was previously having frequency and incontinence. He is no longer incontinent. He has been unable to void spontaneously. He was catheterizing 5-6 times per day with unknown volumes. He continued on Flomax, Vesicare, and recently started taking trospium. AUA score = 20.  He underwent a TURP on 02/01/2021. Pathology showed benign prostate tissue. He was discharged with the Foley catheter in place which he removed several days later. He then  resumed intermittent catheterization.  At his postop visit on 02/17/2021, he was voiding spontaneously with a good stream. No dysuria or gross hematuria. He had noted some cloudy urine. He had not catheterized in over a week. He continued on Flomax. He noted some swelling and pain of the right scrotum. Urine culture grew 50-100 K MRSA. He was also diagnosed with epididymitis. He was treated with doxycycline.  At his visit on 03/04/21, he reported worsening problems with difficulty voiding. He was having nearly continuous leakage of urine. He attempted intermittent catheterization but was unable to pass the catheter and had only return of small clots. He continued to have swelling and discomfort in the right scrotum. He continued on doxycycline. Cystoscopy demonstrated a normal urethra, widely patent prostatic urethra s/p TURP, diffuse erythema of bladder with mucosal edema. He continued to have right scrotal swelling and pain. He was treated with fosfomycin daily x 3 days and fluconazole. A foley was placed. He removed the foley and was voiding spontaneously. No gross hematuria. He continued to have dysuria. He was admitted to [facility]  for fever and MS changes. Treated with IV antibiotics for UTI. No records available. He continued on Cefuroxime.   Portions of the above documentation were copied from a prior visit for review purposes only.   Past Medical History:  Past Medical History:  Diagnosis Date   Acid reflux    Arthritis    Diabetes mellitus without complication (HCC)    High cholesterol    History of kidney cancer    Hypertension    OSA (obstructive sleep apnea)     Past Surgical History:  Past Surgical History:  Procedure Laterality Date   CHOLECYSTECTOMY     LUMBAR DISC SURGERY     NEPHRECTOMY     TRANSURETHRAL RESECTION OF PROSTATE      Allergies:  Allergies  Allergen Reactions   Sulfamethoxazole-Trimethoprim Hives, Itching and Rash    HIVES AND ITCHING HIVES AND  ITCHING    Nitrofurantoin Nausea And Vomiting    Other Reaction(s): GI Intolerance    Family History:  History reviewed. No pertinent family history.  Social History:  Social History   Tobacco Use   Smoking status: Never   Smokeless tobacco: Current    Types: Chew  Substance Use Topics   Alcohol use: Not Currently   Drug use: Never    ROS: Constitutional:  Negative for fever, chills, weight loss CV: Negative for chest pain, previous MI, hypertension Respiratory:  Negative for shortness of breath, wheezing, sleep apnea, frequent cough GI:  Negative for nausea, vomiting, bloody stool, GERD   Physical exam: BP 114/70   Pulse 71   Ht 5\' 7"  (1.702 m)   Wt 220 lb (99.8 kg)   BMI 34.46 kg/m  GENERAL APPEARANCE:  Well appearing, well developed, well nourished, NAD HEENT:  Atraumatic, normocephalic, oropharynx clear NECK:  Supple without lymphadenopathy or thyromegaly ABDOMEN:  Soft, non-tender, no masses EXTREMITIES:  Moves all extremities well, without clubbing, cyanosis, or edema NEUROLOGIC:  Alert and oriented x 3, normal gait, CN II-XII grossly intact MENTAL STATUS:  appropriate BACK:  Non-tender to palpation, No CVAT SKIN:  Warm, dry, and intact GU:  left groin area with healing rash   Results: U/A: 0-5 WBC, 0 RBC

## 2023-10-28 LAB — PSA: Prostate Specific Ag, Serum: 3.4 ng/mL (ref 0.0–4.0)

## 2023-11-02 DIAGNOSIS — J069 Acute upper respiratory infection, unspecified: Secondary | ICD-10-CM | POA: Diagnosis not present

## 2023-11-02 DIAGNOSIS — J101 Influenza due to other identified influenza virus with other respiratory manifestations: Secondary | ICD-10-CM | POA: Diagnosis not present

## 2023-11-03 ENCOUNTER — Ambulatory Visit (HOSPITAL_COMMUNITY): Payer: Medicare PPO

## 2023-11-13 ENCOUNTER — Ambulatory Visit: Payer: Medicare PPO | Admitting: Podiatry

## 2023-11-13 ENCOUNTER — Ambulatory Visit (HOSPITAL_COMMUNITY): Payer: Medicare PPO

## 2023-11-13 ENCOUNTER — Ambulatory Visit (INDEPENDENT_AMBULATORY_CARE_PROVIDER_SITE_OTHER): Payer: Medicare PPO | Admitting: Podiatry

## 2023-11-13 DIAGNOSIS — R1011 Right upper quadrant pain: Secondary | ICD-10-CM | POA: Diagnosis not present

## 2023-11-13 DIAGNOSIS — R160 Hepatomegaly, not elsewhere classified: Secondary | ICD-10-CM | POA: Diagnosis not present

## 2023-11-13 DIAGNOSIS — Z91199 Patient's noncompliance with other medical treatment and regimen due to unspecified reason: Secondary | ICD-10-CM

## 2023-11-13 NOTE — Progress Notes (Signed)
 Patient absent from appointment

## 2023-11-17 DIAGNOSIS — R109 Unspecified abdominal pain: Secondary | ICD-10-CM | POA: Diagnosis not present

## 2023-11-17 DIAGNOSIS — R1011 Right upper quadrant pain: Secondary | ICD-10-CM | POA: Diagnosis not present

## 2023-11-17 DIAGNOSIS — Z9049 Acquired absence of other specified parts of digestive tract: Secondary | ICD-10-CM | POA: Diagnosis not present

## 2023-11-20 ENCOUNTER — Ambulatory Visit (HOSPITAL_COMMUNITY): Payer: Medicare PPO

## 2023-11-22 DIAGNOSIS — R1011 Right upper quadrant pain: Secondary | ICD-10-CM | POA: Diagnosis not present

## 2023-11-22 DIAGNOSIS — Z6836 Body mass index (BMI) 36.0-36.9, adult: Secondary | ICD-10-CM | POA: Diagnosis not present

## 2023-11-24 ENCOUNTER — Ambulatory Visit (HOSPITAL_COMMUNITY): Payer: Medicare PPO

## 2023-11-24 DIAGNOSIS — R1011 Right upper quadrant pain: Secondary | ICD-10-CM | POA: Diagnosis not present

## 2023-11-24 DIAGNOSIS — Z0189 Encounter for other specified special examinations: Secondary | ICD-10-CM | POA: Diagnosis not present

## 2023-11-29 ENCOUNTER — Ambulatory Visit (HOSPITAL_COMMUNITY)
Admission: RE | Admit: 2023-11-29 | Discharge: 2023-11-29 | Disposition: A | Discharge status: Home / Self Care | Source: Ambulatory Visit | Attending: Urology | Admitting: Urology

## 2023-11-29 DIAGNOSIS — N400 Enlarged prostate without lower urinary tract symptoms: Secondary | ICD-10-CM | POA: Diagnosis not present

## 2023-11-29 DIAGNOSIS — K5730 Diverticulosis of large intestine without perforation or abscess without bleeding: Secondary | ICD-10-CM | POA: Diagnosis not present

## 2023-11-29 DIAGNOSIS — N4289 Other specified disorders of prostate: Secondary | ICD-10-CM | POA: Diagnosis not present

## 2023-11-29 DIAGNOSIS — C61 Malignant neoplasm of prostate: Secondary | ICD-10-CM | POA: Insufficient documentation

## 2023-11-29 MED ORDER — GADOBUTROL 1 MMOL/ML IV SOLN
10.0000 mL | Freq: Once | INTRAVENOUS | Status: AC | PRN
  Administered 2023-11-29: 10 mL via INTRAVENOUS

## 2023-12-06 ENCOUNTER — Telehealth: Payer: Self-pay | Admitting: Urology

## 2023-12-06 NOTE — Telephone Encounter (Signed)
 Notified pt as advised. Pt expressed understanding.

## 2023-12-06 NOTE — Telephone Encounter (Signed)
 Pt is wanting a phone call with his MRI prostate results if possible. Looks like it was done on 11/29/23. Please Advise.

## 2023-12-07 ENCOUNTER — Ambulatory Visit (INDEPENDENT_AMBULATORY_CARE_PROVIDER_SITE_OTHER): Admitting: Podiatry

## 2023-12-07 DIAGNOSIS — L84 Corns and callosities: Secondary | ICD-10-CM | POA: Diagnosis not present

## 2023-12-07 DIAGNOSIS — B351 Tinea unguium: Secondary | ICD-10-CM | POA: Diagnosis not present

## 2023-12-07 DIAGNOSIS — M79674 Pain in right toe(s): Secondary | ICD-10-CM

## 2023-12-07 DIAGNOSIS — E1151 Type 2 diabetes mellitus with diabetic peripheral angiopathy without gangrene: Secondary | ICD-10-CM | POA: Diagnosis not present

## 2023-12-07 DIAGNOSIS — M79675 Pain in left toe(s): Secondary | ICD-10-CM

## 2023-12-07 NOTE — Progress Notes (Signed)
       Subjective:  Patient ID: Alexander King, male    DOB: 1946/02/26,  MRN: [id number]  Alexander King presents to clinic today for:  Chief Complaint  Patient presents with   [facility]    [facility] today, the right great toe, lateral border is sensitive, looks a little ingrown. Last A1c was 6.7 in Jan.  He takes ASA 81.    Patient notes nails are thick and elongated, causing pain in shoe gear when ambulating.  Small callus left submet 5.  States his blood sugar has been good.  Feels like the right great toenail, near the 2nd toe, is a little ingrown.     PCP is Lise Auer, MD.  Last seen 10/19/23  Past Medical History:  Diagnosis Date   Acid reflux    Arthritis    Diabetes mellitus without complication (HCC)    High cholesterol    History of kidney cancer    Hypertension    OSA (obstructive sleep apnea)     Allergies  Allergen Reactions   Sulfamethoxazole-Trimethoprim Hives, Itching and Rash    HIVES AND ITCHING HIVES AND ITCHING    Nitrofurantoin Nausea And Vomiting    Other Reaction(s): GI Intolerance   Objective:  Alexander King is a pleasant 78 y.o. male in NAD. AAO x 3.  Vascular Examination: Patient has palpable DP pulse, absent PT pulse bilateral.  Delayed capillary refill bilateral toes.  Sparse digital hair bilateral.  Proximal to distal cooling WNL bilateral.    Dermatological Examination: Interspaces are clear with no open lesions noted bilateral.  Skin is shiny and atrophic bilateral.  Nails are 3-15mm thick, with yellowish/brown discoloration, subungual debris and distal onycholysis x10.  There is pain with compression of nails x10.  No paronychia noted.  There are hyperkeratotic lesions noted left submet 5 .  Patient qualifies for at-risk foot care because of diabetes with PVD .  Assessment/Plan: 1. Pain due to onychomycosis of toenails of both feet   2. Pre-ulcerative calluses   3. Type II diabetes mellitus with peripheral circulatory disorder (HCC)    Mycotic nails  x10 were sharply debrided with sterile nail nippers and power debriding burr to decrease bulk and length.  The hallux nail border was cut back uneventfully.  Hyperkeratotic lesion left submet 5 was shaved with #312 blade.  F/u 3 months   Alexander King DBurna Mortimer, DPM, FACFAS [facility]     [address] [place], [place] [address]                Office [phone]  Fax [phone]

## 2023-12-15 DIAGNOSIS — K5900 Constipation, unspecified: Secondary | ICD-10-CM | POA: Diagnosis not present

## 2023-12-15 DIAGNOSIS — Z6836 Body mass index (BMI) 36.0-36.9, adult: Secondary | ICD-10-CM | POA: Diagnosis not present

## 2023-12-15 DIAGNOSIS — E1165 Type 2 diabetes mellitus with hyperglycemia: Secondary | ICD-10-CM | POA: Diagnosis not present

## 2024-01-24 ENCOUNTER — Ambulatory Visit: Payer: Medicare PPO | Admitting: Urology

## 2024-01-25 DIAGNOSIS — E782 Mixed hyperlipidemia: Secondary | ICD-10-CM | POA: Diagnosis not present

## 2024-01-25 DIAGNOSIS — E1142 Type 2 diabetes mellitus with diabetic polyneuropathy: Secondary | ICD-10-CM | POA: Diagnosis not present

## 2024-01-25 DIAGNOSIS — N183 Chronic kidney disease, stage 3 unspecified: Secondary | ICD-10-CM | POA: Diagnosis not present

## 2024-02-01 DIAGNOSIS — E1142 Type 2 diabetes mellitus with diabetic polyneuropathy: Secondary | ICD-10-CM | POA: Diagnosis not present

## 2024-02-01 DIAGNOSIS — N183 Chronic kidney disease, stage 3 unspecified: Secondary | ICD-10-CM | POA: Diagnosis not present

## 2024-02-01 DIAGNOSIS — Z6836 Body mass index (BMI) 36.0-36.9, adult: Secondary | ICD-10-CM | POA: Diagnosis not present

## 2024-02-01 DIAGNOSIS — I10 Essential (primary) hypertension: Secondary | ICD-10-CM | POA: Diagnosis not present

## 2024-02-01 DIAGNOSIS — C61 Malignant neoplasm of prostate: Secondary | ICD-10-CM | POA: Diagnosis not present

## 2024-02-07 ENCOUNTER — Ambulatory Visit: Admitting: Urology

## 2024-02-29 ENCOUNTER — Other Ambulatory Visit: Payer: Self-pay | Admitting: Urology

## 2024-02-29 DIAGNOSIS — N138 Other obstructive and reflux uropathy: Secondary | ICD-10-CM

## 2024-03-12 ENCOUNTER — Ambulatory Visit: Admitting: Podiatry

## 2024-03-12 ENCOUNTER — Encounter: Payer: Self-pay | Admitting: Podiatry

## 2024-03-12 DIAGNOSIS — L84 Corns and callosities: Secondary | ICD-10-CM

## 2024-03-12 DIAGNOSIS — M79674 Pain in right toe(s): Secondary | ICD-10-CM | POA: Diagnosis not present

## 2024-03-12 DIAGNOSIS — B351 Tinea unguium: Secondary | ICD-10-CM | POA: Diagnosis not present

## 2024-03-12 DIAGNOSIS — M79675 Pain in left toe(s): Secondary | ICD-10-CM | POA: Diagnosis not present

## 2024-03-12 DIAGNOSIS — E1140 Type 2 diabetes mellitus with diabetic neuropathy, unspecified: Secondary | ICD-10-CM | POA: Diagnosis not present

## 2024-03-12 NOTE — Progress Notes (Signed)
  Subjective:  Patient ID: Alexander King, male    DOB: [birth year],  MRN: [id number]  Chief Complaint  Patient presents with   [facility]    [facility] with one callous to the left lateral. Please cut the nails back as far as you can safely. Last A1c was 6.4 in April takes ASA     78 y.o. male presents with the above complaint. History confirmed with patient. Patient presenting with pain related to dystrophic thickened elongated nails. Patient is unable to trim own nails related to nail dystrophy. Patient does have a history of T2DM. Does have callus left sub 5th metatarsal head today causing pain.  Does report some incurvation to the first toenails medial borders that can cause discomfort.  Objective:  Physical Exam: warm, good capillary refill nail exam onychomycosis of the toenails, onycholysis, dystrophic nails, and some incurvation of the bilateral hallux nails medial borders predominantly without signs of infection. DP pulses palpable, PT pulses non palpable, protective sensation absent, and vibratory sensation diminished Left Foot:  Pain with palpation of nails due to elongation and dystrophic growth.  Preulcerative callus left subfifth metatarsal head Right Foot: Pain with palpation of nails due to elongation and dystrophic growth.   Assessment:   1. Pain due to onychomycosis of toenails of both feet   2. Pre-ulcerative calluses   3. Type 2 diabetes mellitus with diabetic neuropathy, without long-term current use of insulin (HCC)      Plan:  Patient was evaluated and treated and all questions answered.  #Hyperkeratotic lesions/pre ulcerative calluses present left subfifth metatarsal head All symptomatic hyperkeratoses x 1 separate lesions were safely debrided with a sterile #312 blade to patient's level of comfort without incident. We discussed preventative and palliative care of these lesions including supportive and accommodative shoegear, padding, prefabricated and custom molded accommodative  orthoses, use of a pumice stone and lotions/creams daily. - Diminished PT pulses, at risk diabetic footcare  #Onychomycosis with pain  -Nails palliatively debrided as below. -Educated on self-care  Procedure: Nail Debridement Rationale: Pain Type of Debridement: manual, sharp debridement. Instrumentation: Nail nipper, rotary burr. Number of Nails: 10  # Diabetes with neuropathy -Patient educated on diabetes. Discussed proper diabetic foot care and discussed risks and complications of disease. Educated patient in depth on reasons to return to the office immediately should he/she discover anything concerning or new on the feet. All questions answered. Discussed proper shoes as well.  - Reports significant neuropathic pain taking gabapentin 600 mg 2-3 times a day.  Did briefly discuss referral for spinal cord stimulator, he would like to consider this going forward.  Return in about 3 months (around 06/12/2024) for Diabetic Foot Care.         Eve Hinders, DPM [facility] / [facility]

## 2024-03-15 ENCOUNTER — Encounter: Payer: Self-pay | Admitting: Urology

## 2024-03-15 ENCOUNTER — Ambulatory Visit: Admitting: Urology

## 2024-03-15 VITALS — BP 120/49 | HR 92 | Ht 67.0 in | Wt 232.0 lb

## 2024-03-15 DIAGNOSIS — Z87440 Personal history of urinary (tract) infections: Secondary | ICD-10-CM

## 2024-03-15 DIAGNOSIS — N138 Other obstructive and reflux uropathy: Secondary | ICD-10-CM

## 2024-03-15 DIAGNOSIS — B356 Tinea cruris: Secondary | ICD-10-CM

## 2024-03-15 DIAGNOSIS — C61 Malignant neoplasm of prostate: Secondary | ICD-10-CM | POA: Diagnosis not present

## 2024-03-15 DIAGNOSIS — N401 Enlarged prostate with lower urinary tract symptoms: Secondary | ICD-10-CM

## 2024-03-15 LAB — MICROSCOPIC EXAMINATION

## 2024-03-15 LAB — URINALYSIS, ROUTINE W REFLEX MICROSCOPIC
Bilirubin, UA: NEGATIVE
Ketones, UA: NEGATIVE
Leukocytes,UA: NEGATIVE
Nitrite, UA: NEGATIVE
RBC, UA: NEGATIVE
Specific Gravity, UA: 1.02 (ref 1.005–1.030)
Urobilinogen, Ur: 0.2 mg/dL (ref 0.2–1.0)
pH, UA: 5.5 (ref 5.0–7.5)

## 2024-03-15 MED ORDER — FLUCONAZOLE 150 MG PO TABS: 150.0000 mg | ORAL_TABLET | ORAL | 3 refills | Status: AC | PRN

## 2024-03-15 NOTE — Progress Notes (Signed)
 Assessment: 1. BPH with obstruction/lower urinary tract symptoms   2. Prostate cancer (HCC); Gleason 6; T1c: on active surveillance since 7/18   3. History of UTI   4. Tinea cruris     Plan: Continue tamsulosin  0.4 mg daily. PSA today Continue Myrbetriq  25 mg daily.  Recommend taking this at night to see if this improves his nighttime symptoms.  Refill of fluconazole  for as needed use sent. Return to office in 6 months.  Chief Complaint:  Chief Complaint  Patient presents with   Benign Prostatic Hypertrophy    History of Present Illness:  Alexander King is a 78 y.o. male who is seen for evaluation of prostate cancer, BPH with obstruction, neurogenic bladder, urge incontinence, and history of UTIs. He was previously followed at [facility] health urology in [facility].  He was last seen by me in June 2022.  At his visit in 8/24, he reported improvement in his lower urinary tract symptoms.  He was no longer taking tamsulosin  and was no longer performing intermittent catheterization.  He continued on Myrbetriq  25 mg daily.  He felt like he emptied his bladder well.  He continued with some frequency and urgency.  He had not had any significant UTI's for the past 2 years.  He noted some dysuria  about 2 weeks prior.  He had recently taken a dose of fosfomycin and fluconazole .  No gross hematuria or flank pain.   IPSS = 4 Urine culture: <10K colonies PSA 2.8  At his visit in 11/24, he reported dysuria x 3 weeks.  His urinary symptoms were very stable up until that point.  He had increased frequency, urgency, and some urge incontinence in association with the dysuria.  No gross hematuria.  He was evaluated with a urinalysis on 08/07/2023 which showed 2+ leukocytes, and positive blood on dipstick.  Urine culture grew 25-50 K mixed flora.  He was not treated with any antibiotics.  He had recently taken fosfomycin. He was no longer taking Myrbetriq . IPSS = 21. He also reported a erythematous rash  in both groin areas with some discomfort.  He has been using topical nystatin cream and powder without benefit. He was treated with Cipro  x 5 days. Resolve MDX urine culture showed no growth. He was also started on fluconazole  for the tinea cruris. He continued to have dysuria.  He was started on tamsulosin . He continued with frequency and decreased stream.  He also had constant burning in the bladder and urethral area which was increased at the end of voiding.  No gross hematuria or flank pain. IPSS = 20. He was treated with Levaquin  for prostatitis in 12/24.  He developed a rash which was possibly due to the Levaquin .  He was changed to cefdinir .  At his visit in January 2025, he reported that his dysuria had resolved after taking some over-the-counter AZO.  He felt like his voiding symptoms were improved.  He continued with some frequency, urgency and nocturia x 2.  No dysuria or gross hematuria. IPSS = 8. He continued on tamsulosin .  He reported episodes of urge incontinence and postvoid dribbling requiring pad usage. He continued to have problems with tinea cruris.  He was itraconazole with improvement. PSA 1/25:  3.4 Prostate MRI from 3/25 showed a PI-RADS 3 lesion in the left anterior transition zone at the apex, prostate volume of 53.5 cm.  He presents today for scheduled follow-up.  He continues on tamsulosin  and Myrbetriq .  He is overall doing well from  a urinary standpoint.  He reports that his urinary symptoms are well-controlled other than nighttime incontinence.  No dysuria or gross hematuria. IPSS = 10/2. He continues to use fluconazole  as needed for occasional burning with urination.  The fluconazole  does improve his symptoms.  No recent UTIs.  Urologic History: He presented with urinary incontinence. He had onset of incontinence without sensory awareness following lumbar surgery in April 2017. He underwent a L4-5 fusion. He had frequency, urgency, and incontinence since that  time. No symptoms prior to surgery. He was using 4 pullups/day. He tried tamsulosin  and Myrbetriq  without benefit. He has been evaluated with MRI and nerve conduction studies. No fecal incontinence. He was given a trial of Vesicare but discontinued due to dizziness. He was given samples of Toviaz and noted some slight improvement in his frequency and urgency but continued to have incontinence.  PVR at prior visit was 25 mL. Cystoscopy from August 2017 showed lateral lobe enlargement with a median lobe, minimal trabeculations within the bladder. Urodynamics showed a small capacity noncompliant bladder with associated incontinence. He was started on clean intermittent catheterization after his visit on 06/07/16.  He continues to have significant lower urinary tract symptoms including frequency, urgency, nocturia x4, sensation of incomplete emptying, hesitancy, and dribbling. He also has occasional incontinence. He has been taking tamsulosin  and Vesicare. No dysuria or gross hematuria. AUA score = 14. Urodynamics from 03/12/2020 showed decreased capacity, instability without leakage, high pressure voiding with obstructed flow pattern, increased EMG activity during voiding, with post void residual of 89 mL. He continued with lower urinary tract symptoms. He has urgency, frequency, intermittent stream, hesitancy, decreased force of stream, and nocturia. He does not feel like he empties his bladder completely. He was not having any dysuria or gross hematuria.  AUA score = 20. Cystoscopy from 06/02/2020 showed lateral lobe enlargement with a median lobe and bladder trabeculations.  Urine culture from 12/18 grew Klebsiella. Treated with Cipro .  He continued on daily TMP. Urine culture from 1/19 grew Klebsiella. Treated with Augmentin. He was started on ampicillin daily. Urine culture from 2/19 grew Klebsiella.  He discontinued the daily ampicillin.  He was diagnosed with a symptomatic UTI in July 2020. Urine  culture grew 25-50 K staph epidermidis. He was diagnosed with another symptomatic UTI on 06/12/2019. Urine culture grew >100 K Klebsiella. He was treated with Cipro .  He reports being treated for UTIs x2 in October. He was started on daily Keflex in late October 2020.  He was treated for a UTI in December 2020.  Urine culture from 3/21 grew >100 K Klebsiella. He was treated with Cipro . Urine culture from 6/21 grew >100K MRSA. Treated with Macrobid.  He has a history of UTI symptoms. A urinalysis from 07/02/2020 showed blood on dipstick. No urine culture was obtained. He was treated with Cipro  x7 days. He reports return of his symptoms shortly after completing the Cipro . A urinalysis from 07/13/2020 showed 1+ leukocyte esterase and positive blood.  He was scheduled for a TURP on 07/16/2020. He continued to perform intermittent catheterization. He reported worsening of his urinary symptoms including increased frequency, urgency, and nocturia.  He has been evaluated by Dr. Allyson Jack in 9/21 and was started on methenamine for UTI prevention. Urine culture from 2/22: >100 K Enterococcus. Treated with Cipro  Urine culture from 2/22: No growth Urine culture from 3/22: No growth  He underwent a renal ultrasound in May 2017 which showed left renal pelvic caliectasis and a moderately distended bladder. He also  had a scrotal ultrasound in May 2017 which showed asymmetric heterogeneous appearance of the left testicle as compared to the right without obvious testicular mass. A repeat ultrasound in 3 months was recommended to evaluate for stability. Scrotal ultrasound from 06/21/16 showed a normal right testicle without mass or microlithiasis, heterogeneous echotexture to the left testicle without obvious mass lesion, bilateral epididymal cyst, and small bilateral hydroceles. Scrotal U/S from 3/18 showed heterogeneity of the left testicle, decreased from prior exam, without obvious mass, bilateral epididymal cysts, and  small bilateral hydroceles.   Cr from 2/18 was 2.49. Cr from 4/18 was 2.85. Cr from 9/18 was 2.16. Cr from 5/19 is 1.9. Cr from 9/19 was 1.7. Cr from 1/20 was 1.8. Cr from 5/20 was 1.7.  Creatinine from 1/21 was 1.7. Creatinine from 5/21: 1.6  PSA from 2/18 5.0. PSA from 4/18 was 13.2 (drawn at time of UTI). 4K score from 03/24/17 showed a PSA of 4.18, free PSA of 20%, and 14% probability of aggressive prostate cancer on biopsy. Rectal culture showed no FQ resistant organisms.  He underwent transrectal ultrasound and biopsy of the prostate on 04/20/17. PSA: 4.18 ng/ml DRE: normal TRUS volume: 54.72 ml Biopsy results:  Gleason score: 3+ 3 = 6 # positive cores: 1/6 on right 0/6 on left Location of cancer: base  He elected to proceed with active surveillance for management of his localized prostate cancer.  PSA from 06/19/17: 3.37 PSA from 10/04/17: 7.51 (Drawn at time of UTI) PSA from 11/15/17: 5.23 PSA from 01/31/18: 3.10 PSA from 06/14/18: 1.68 Prostate MRI from 07/12/2018 showed no evidence of macroscopic or high-grade carcinoma, multiple areas of ill-defined hypointensity in the central gland likely related to BPH, PI-RADS 2-3. PSA from 10/18/18: 2.83 PSA from 5/20: 1.98 PSA from 9/20: 2.16 PSA from 1/21: 2.82 PSA from 5/21: 2.39 PSA from 1/22: 3.11 PSA from 8/22: 3.15  CT urogram from 12/21 showed a indeterminate density lesion in the upper pole of the left kidney approximately 13 mm in size. No kidney stones or obstruction noted. Further evaluation with a MRI showed a hemorrhagic cyst involving the left kidney.  He reports being treated for a UTI with a urine culture from 01/03/2021 showing 1000 colonies of Enterococcus. He was treated with Macrobid. He reported developing some fever and chills. He was changed to Levaquin . His fevers esolved. He was previously having frequency and incontinence. He is no longer incontinent. He has been unable to void spontaneously. He was catheterizing  5-6 times per day with unknown volumes. He continued on Flomax , Vesicare, and recently started taking trospium. AUA score = 20.  He underwent a TURP on 02/01/2021. Pathology showed benign prostate tissue. He was discharged with the Foley catheter in place which he removed several days later. He then resumed intermittent catheterization.  At his postop visit on 02/17/2021, he was voiding spontaneously with a good stream. No dysuria or gross hematuria. He had noted some cloudy urine. He had not catheterized in over a week. He continued on Flomax . He noted some swelling and pain of the right scrotum. Urine culture grew 50-100 K MRSA. He was also diagnosed with epididymitis. He was treated with doxycycline.  At his visit on 03/04/21, he reported worsening problems with difficulty voiding. He was having nearly continuous leakage of urine. He attempted intermittent catheterization but was unable to pass the catheter and had only return of small clots. He continued to have swelling and discomfort in the right scrotum. He continued on doxycycline. Cystoscopy demonstrated  a normal urethra, widely patent prostatic urethra s/p TURP, diffuse erythema of bladder with mucosal edema. He continued to have right scrotal swelling and pain. He was treated with fosfomycin daily x 3 days and fluconazole . A foley was placed. He removed the foley and was voiding spontaneously. No gross hematuria. He continued to have dysuria. He was admitted to [facility] for fever and MS changes. Treated with IV antibiotics for UTI. No records available. He continued on Cefuroxime.   Portions of the above documentation were copied from a prior visit for review purposes only.   Past Medical History:  Past Medical History:  Diagnosis Date   Acid reflux    Arthritis    Diabetes mellitus without complication (HCC)    High cholesterol    History of kidney cancer    Hypertension    OSA (obstructive sleep apnea)     Past Surgical  History:  Past Surgical History:  Procedure Laterality Date   CHOLECYSTECTOMY     LUMBAR DISC SURGERY     NEPHRECTOMY     TRANSURETHRAL RESECTION OF PROSTATE      Allergies:  Allergies  Allergen Reactions   Sulfamethoxazole-Trimethoprim Hives, Itching and Rash    HIVES AND ITCHING HIVES AND ITCHING    Nitrofurantoin Nausea And Vomiting    Other Reaction(s): GI Intolerance    Family History:  No family history on file.  Social History:  Social History   Tobacco Use   Smoking status: Never   Smokeless tobacco: Current    Types: Chew  Substance Use Topics   Alcohol use: Not Currently   Drug use: Never    ROS: Constitutional:  Negative for fever, chills, weight loss CV: Negative for chest pain, previous MI, hypertension Respiratory:  Negative for shortness of breath, wheezing, sleep apnea, frequent cough GI:  Negative for nausea, vomiting, bloody stool, GERD   Physical exam: BP (!) 120/49   Pulse 92   Ht 5' 7 (1.702 m)   Wt 232 lb (105.2 kg)   BMI 36.34 kg/m  GENERAL APPEARANCE:  Well appearing, well developed, well nourished, NAD HEENT:  Atraumatic, normocephalic, oropharynx clear NECK:  Supple without lymphadenopathy or thyromegaly ABDOMEN:  Soft, non-tender, no masses EXTREMITIES:  Moves all extremities well, without clubbing, cyanosis, or edema NEUROLOGIC:  Alert and oriented x 3, normal gait, CN II-XII grossly intact MENTAL STATUS:  appropriate BACK:  Non-tender to palpation, No CVAT SKIN:  Warm, dry, and intact   Results: U/A: 0-5 WBCs, 0-2 RBCs

## 2024-03-16 LAB — PSA: Prostate Specific Ag, Serum: 2.9 ng/mL (ref 0.0–4.0)

## 2024-03-18 ENCOUNTER — Ambulatory Visit: Payer: Self-pay | Admitting: Urology

## 2024-03-19 ENCOUNTER — Other Ambulatory Visit: Payer: Self-pay | Admitting: Urology

## 2024-03-19 DIAGNOSIS — N138 Other obstructive and reflux uropathy: Secondary | ICD-10-CM

## 2024-03-25 ENCOUNTER — Other Ambulatory Visit: Payer: Self-pay

## 2024-03-25 DIAGNOSIS — N138 Other obstructive and reflux uropathy: Secondary | ICD-10-CM

## 2024-03-25 MED ORDER — TAMSULOSIN HCL 0.4 MG PO CAPS: 0.4000 mg | ORAL_CAPSULE | Freq: Every day | ORAL | 5 refills | Status: DC

## 2024-04-02 DIAGNOSIS — Z1339 Encounter for screening examination for other mental health and behavioral disorders: Secondary | ICD-10-CM | POA: Diagnosis not present

## 2024-04-02 DIAGNOSIS — Z6837 Body mass index (BMI) 37.0-37.9, adult: Secondary | ICD-10-CM | POA: Diagnosis not present

## 2024-04-02 DIAGNOSIS — Z1331 Encounter for screening for depression: Secondary | ICD-10-CM | POA: Diagnosis not present

## 2024-04-02 DIAGNOSIS — R413 Other amnesia: Secondary | ICD-10-CM | POA: Diagnosis not present

## 2024-04-02 DIAGNOSIS — E1142 Type 2 diabetes mellitus with diabetic polyneuropathy: Secondary | ICD-10-CM | POA: Diagnosis not present

## 2024-04-02 DIAGNOSIS — Z9181 History of falling: Secondary | ICD-10-CM | POA: Diagnosis not present

## 2024-06-03 DIAGNOSIS — C61 Malignant neoplasm of prostate: Secondary | ICD-10-CM | POA: Diagnosis not present

## 2024-06-03 DIAGNOSIS — E1142 Type 2 diabetes mellitus with diabetic polyneuropathy: Secondary | ICD-10-CM | POA: Diagnosis not present

## 2024-06-03 LAB — LAB REPORT - SCANNED
A1c: 6.3
EGFR: 43

## 2024-06-10 DIAGNOSIS — R296 Repeated falls: Secondary | ICD-10-CM | POA: Diagnosis not present

## 2024-06-10 DIAGNOSIS — E1122 Type 2 diabetes mellitus with diabetic chronic kidney disease: Secondary | ICD-10-CM | POA: Diagnosis not present

## 2024-06-10 DIAGNOSIS — E1142 Type 2 diabetes mellitus with diabetic polyneuropathy: Secondary | ICD-10-CM | POA: Diagnosis not present

## 2024-06-10 DIAGNOSIS — E1169 Type 2 diabetes mellitus with other specified complication: Secondary | ICD-10-CM | POA: Diagnosis not present

## 2024-06-10 DIAGNOSIS — Z6837 Body mass index (BMI) 37.0-37.9, adult: Secondary | ICD-10-CM | POA: Diagnosis not present

## 2024-06-10 DIAGNOSIS — Z23 Encounter for immunization: Secondary | ICD-10-CM | POA: Diagnosis not present

## 2024-06-10 DIAGNOSIS — C61 Malignant neoplasm of prostate: Secondary | ICD-10-CM | POA: Diagnosis not present

## 2024-06-11 ENCOUNTER — Encounter: Payer: Self-pay | Admitting: Podiatry

## 2024-06-11 ENCOUNTER — Ambulatory Visit: Admitting: Podiatry

## 2024-06-11 DIAGNOSIS — B351 Tinea unguium: Secondary | ICD-10-CM | POA: Diagnosis not present

## 2024-06-11 DIAGNOSIS — E1140 Type 2 diabetes mellitus with diabetic neuropathy, unspecified: Secondary | ICD-10-CM | POA: Diagnosis not present

## 2024-06-11 DIAGNOSIS — M79675 Pain in left toe(s): Secondary | ICD-10-CM | POA: Diagnosis not present

## 2024-06-11 DIAGNOSIS — M79674 Pain in right toe(s): Secondary | ICD-10-CM

## 2024-06-11 DIAGNOSIS — L84 Corns and callosities: Secondary | ICD-10-CM | POA: Diagnosis not present

## 2024-06-11 NOTE — Progress Notes (Unsigned)
  Subjective:  Patient ID: Alexander King, male    DOB: Oct 21, 1945,  MRN: [id number]  Chief Complaint  Patient presents with   [facility]    [facility] glucose 106 this AM.  Callus L 5th meta plantar . R great toe soreness.      78 y.o. male presents with the above complaint. History confirmed with patient. Patient presenting with pain related to dystrophic thickened elongated nails. Patient is unable to trim own nails related to nail dystrophy. Patient does have a history of T2DM. Does have callus left sub 5th metatarsal head today causing pain.  Objective:  Physical Exam: warm, good capillary refill nail exam onychomycosis of the toenails, onycholysis, dystrophic nails, and some incurvation of the bilateral hallux nails medial borders predominantly without signs of infection. DP pulses palpable, PT pulses non palpable, protective sensation absent, and vibratory sensation diminished Left Foot:  Pain with palpation of nails due to elongation and dystrophic growth.  Preulcerative callus left subfifth metatarsal head Right Foot: Pain with palpation of nails due to elongation and dystrophic growth.   Assessment:   1. Pain due to onychomycosis of toenails of both feet   2. Type 2 diabetes mellitus with diabetic neuropathy, without long-term current use of insulin (HCC)   3. Callus      Plan:  Patient was evaluated and treated and all questions answered.  #Hyperkeratotic lesions/pre ulcerative calluses present left subfifth metatarsal head All symptomatic hyperkeratoses x 1 separate lesions were safely debrided with a sterile #312 blade to patient's level of comfort without incident. We discussed preventative and palliative care of these lesions including supportive and accommodative shoegear, padding, prefabricated and custom molded accommodative orthoses, use of a pumice stone and lotions/creams daily. - Diminished PT pulses, at risk diabetic footcare  #Onychomycosis with pain  -Nails palliatively  debrided as below. -Educated on self-care  Procedure: Nail Debridement Rationale: Pain Type of Debridement: manual, sharp debridement. Instrumentation: Nail nipper, rotary burr. Number of Nails: 10   # Diabetes with neuropathy - Patient educated on diabetes. Discussed proper diabetic foot care and discussed risks and complications of disease. Educated patient in depth on reasons to return to the office immediately should he/she discover anything concerning or new on the feet. All questions answered. Discussed proper shoes as well.    Return in about 3 months (around 09/10/2024) for Diabetic Foot Care.         Ethan Saddler, DPM [facility] / [facility]

## 2024-06-18 DIAGNOSIS — H35373 Puckering of macula, bilateral: Secondary | ICD-10-CM | POA: Diagnosis not present

## 2024-06-19 DIAGNOSIS — M7501 Adhesive capsulitis of right shoulder: Secondary | ICD-10-CM | POA: Diagnosis not present

## 2024-06-24 DIAGNOSIS — M7501 Adhesive capsulitis of right shoulder: Secondary | ICD-10-CM | POA: Diagnosis not present

## 2024-06-24 DIAGNOSIS — M25511 Pain in right shoulder: Secondary | ICD-10-CM | POA: Diagnosis not present

## 2024-07-22 DIAGNOSIS — I10 Essential (primary) hypertension: Secondary | ICD-10-CM | POA: Diagnosis not present

## 2024-07-22 DIAGNOSIS — F8081 Childhood onset fluency disorder: Secondary | ICD-10-CM | POA: Diagnosis not present

## 2024-07-22 DIAGNOSIS — R5383 Other fatigue: Secondary | ICD-10-CM | POA: Diagnosis not present

## 2024-07-22 DIAGNOSIS — N183 Chronic kidney disease, stage 3 unspecified: Secondary | ICD-10-CM | POA: Diagnosis not present

## 2024-07-22 DIAGNOSIS — R5381 Other malaise: Secondary | ICD-10-CM | POA: Diagnosis not present

## 2024-07-23 ENCOUNTER — Other Ambulatory Visit: Payer: Self-pay | Admitting: Urology

## 2024-08-02 DIAGNOSIS — Z6836 Body mass index (BMI) 36.0-36.9, adult: Secondary | ICD-10-CM | POA: Diagnosis not present

## 2024-08-02 DIAGNOSIS — N183 Chronic kidney disease, stage 3 unspecified: Secondary | ICD-10-CM | POA: Diagnosis not present

## 2024-08-12 ENCOUNTER — Telehealth: Payer: Self-pay | Admitting: Urology

## 2024-08-12 NOTE — Telephone Encounter (Signed)
 Alexander King called today asking if he could be started on a maintenance medication for yeast infections. He would like to start taking Diflucan  150mg  once a week for 6 months to see if this will help clear up the issue. Please advise the patient will your thoughts. If this is something that can be done he uses [facility] on [address] in [place].

## 2024-08-19 DIAGNOSIS — N186 End stage renal disease: Secondary | ICD-10-CM | POA: Diagnosis not present

## 2024-08-19 DIAGNOSIS — I129 Hypertensive chronic kidney disease with stage 1 through stage 4 chronic kidney disease, or unspecified chronic kidney disease: Secondary | ICD-10-CM | POA: Diagnosis not present

## 2024-08-19 DIAGNOSIS — R270 Ataxia, unspecified: Secondary | ICD-10-CM | POA: Diagnosis not present

## 2024-08-19 DIAGNOSIS — E1142 Type 2 diabetes mellitus with diabetic polyneuropathy: Secondary | ICD-10-CM | POA: Diagnosis not present

## 2024-08-19 DIAGNOSIS — R002 Palpitations: Secondary | ICD-10-CM | POA: Diagnosis not present

## 2024-08-19 DIAGNOSIS — R4701 Aphasia: Secondary | ICD-10-CM | POA: Diagnosis not present

## 2024-08-19 DIAGNOSIS — I6350 Cerebral infarction due to unspecified occlusion or stenosis of unspecified cerebral artery: Secondary | ICD-10-CM | POA: Diagnosis not present

## 2024-08-19 DIAGNOSIS — R29818 Other symptoms and signs involving the nervous system: Secondary | ICD-10-CM | POA: Diagnosis not present

## 2024-08-19 DIAGNOSIS — E785 Hyperlipidemia, unspecified: Secondary | ICD-10-CM | POA: Diagnosis not present

## 2024-08-19 DIAGNOSIS — N183 Chronic kidney disease, stage 3 unspecified: Secondary | ICD-10-CM | POA: Diagnosis not present

## 2024-08-19 DIAGNOSIS — E1122 Type 2 diabetes mellitus with diabetic chronic kidney disease: Secondary | ICD-10-CM | POA: Diagnosis not present

## 2024-08-19 DIAGNOSIS — E119 Type 2 diabetes mellitus without complications: Secondary | ICD-10-CM | POA: Diagnosis not present

## 2024-08-19 DIAGNOSIS — R2681 Unsteadiness on feet: Secondary | ICD-10-CM | POA: Diagnosis not present

## 2024-08-19 DIAGNOSIS — R1310 Dysphagia, unspecified: Secondary | ICD-10-CM | POA: Diagnosis not present

## 2024-08-20 DIAGNOSIS — I517 Cardiomegaly: Secondary | ICD-10-CM | POA: Diagnosis not present

## 2024-08-27 DIAGNOSIS — E785 Hyperlipidemia, unspecified: Secondary | ICD-10-CM | POA: Diagnosis not present

## 2024-08-27 DIAGNOSIS — I10 Essential (primary) hypertension: Secondary | ICD-10-CM | POA: Diagnosis not present

## 2024-08-27 DIAGNOSIS — G459 Transient cerebral ischemic attack, unspecified: Secondary | ICD-10-CM | POA: Diagnosis not present

## 2024-08-28 DIAGNOSIS — Z6836 Body mass index (BMI) 36.0-36.9, adult: Secondary | ICD-10-CM | POA: Diagnosis not present

## 2024-08-28 DIAGNOSIS — G459 Transient cerebral ischemic attack, unspecified: Secondary | ICD-10-CM | POA: Diagnosis not present

## 2024-08-28 DIAGNOSIS — E1169 Type 2 diabetes mellitus with other specified complication: Secondary | ICD-10-CM | POA: Diagnosis not present

## 2024-09-04 ENCOUNTER — Ambulatory Visit: Admitting: Urology

## 2024-09-10 ENCOUNTER — Ambulatory Visit: Admitting: Podiatry

## 2024-09-10 ENCOUNTER — Encounter: Payer: Self-pay | Admitting: Podiatry

## 2024-09-10 DIAGNOSIS — M79675 Pain in left toe(s): Secondary | ICD-10-CM | POA: Diagnosis not present

## 2024-09-10 DIAGNOSIS — B351 Tinea unguium: Secondary | ICD-10-CM | POA: Diagnosis not present

## 2024-09-10 DIAGNOSIS — L84 Corns and callosities: Secondary | ICD-10-CM

## 2024-09-10 DIAGNOSIS — M79674 Pain in right toe(s): Secondary | ICD-10-CM

## 2024-09-10 DIAGNOSIS — E1140 Type 2 diabetes mellitus with diabetic neuropathy, unspecified: Secondary | ICD-10-CM | POA: Diagnosis not present

## 2024-09-10 NOTE — Progress Notes (Unsigned)
°  Subjective:  Patient ID: Alexander King, male    DOB: October 07, 1945,  MRN: [id number]  Chief Complaint  Patient presents with   [facility]    [facility] with out callous.  A1c was 6.1 in Sep, Plavix and ASA    78 y.o. male presents with the above complaint. History confirmed with patient. Patient presenting with pain related to dystrophic thickened elongated nails. Patient is unable to trim own nails related to nail dystrophy. Patient does have a history of T2DM. Does have callus left sub 5th metatarsal head today causing pain.  Objective:  Physical Exam: warm, good capillary refill nail exam onychomycosis of the toenails, onycholysis, dystrophic nails, and some incurvation of the bilateral hallux nails medial borders predominantly without signs of infection. DP pulses palpable, PT pulses non palpable, protective sensation absent, and vibratory sensation diminished Left Foot:  Pain with palpation of nails due to elongation and dystrophic growth.  Preulcerative callus left subfifth metatarsal head Right Foot: Pain with palpation of nails due to elongation and dystrophic growth.   Assessment:   1. Pain due to onychomycosis of toenails of both feet   2. Type 2 diabetes mellitus with diabetic neuropathy, without long-term current use of insulin (HCC)   3. Callus      Plan:  Patient was evaluated and treated and all questions answered.  #Hyperkeratotic lesions/pre ulcerative calluses present left subfifth metatarsal head All symptomatic hyperkeratoses x1 were safely debrided with a sterile #15 blade to patient's level of comfort without incident. We discussed preventative and palliative care of these lesions including supportive and accommodative shoegear, padding, prefabricated and custom molded accommodative orthoses, use of a pumice stone and lotions/creams daily.  - Diminished PT pulses, at risk diabetic footcare  #Onychomycosis with pain  -Nails palliatively debrided as below. -Educated on  self-care  Procedure: Nail Debridement Rationale: Pain Type of Debridement: manual, sharp debridement. Instrumentation: Nail nipper, rotary burr. Number of Nails: 10    # Diabetes with neuropathy Patient educated on diabetes. Discussed proper diabetic foot care and discussed risks and complications of disease. Educated patient in depth on reasons to return to the office immediately should he/she discover anything concerning or new on the feet. All questions answered. Discussed proper shoes as well.     Return in about 3 months (around 12/09/2024) for Diabetic Foot Care.         Ethan Saddler, DPM [facility] / [facility]

## 2024-09-10 NOTE — Patient Instructions (Signed)
 Look for urea 40% cream or ointment and apply to the thickened dry skin / calluses. This can be bought over the counter, at a pharmacy or online such as [employer].

## 2024-09-13 ENCOUNTER — Ambulatory Visit: Admitting: Urology

## 2024-09-22 ENCOUNTER — Other Ambulatory Visit: Payer: Self-pay | Admitting: Urology

## 2024-09-22 DIAGNOSIS — N138 Other obstructive and reflux uropathy: Secondary | ICD-10-CM

## 2024-12-09 ENCOUNTER — Ambulatory Visit: Admitting: Podiatry
